# Patient Record
Sex: Female | Born: 1950 | Race: White | Hispanic: No | Marital: Married | Smoking: Never smoker
Health system: Southern US, Community
[De-identification: ages and names within clinical notes are randomized; demographics above are authoritative.]

## PROBLEM LIST (undated history)

## (undated) DIAGNOSIS — Z973 Presence of spectacles and contact lenses: Secondary | ICD-10-CM

## (undated) DIAGNOSIS — M1990 Unspecified osteoarthritis, unspecified site: Secondary | ICD-10-CM

## (undated) DIAGNOSIS — E785 Hyperlipidemia, unspecified: Secondary | ICD-10-CM

## (undated) DIAGNOSIS — Z87442 Personal history of urinary calculi: Secondary | ICD-10-CM

## (undated) DIAGNOSIS — Z8489 Family history of other specified conditions: Secondary | ICD-10-CM

## (undated) DIAGNOSIS — F329 Major depressive disorder, single episode, unspecified: Secondary | ICD-10-CM

## (undated) DIAGNOSIS — I10 Essential (primary) hypertension: Secondary | ICD-10-CM

## (undated) DIAGNOSIS — L80 Vitiligo: Secondary | ICD-10-CM

## (undated) DIAGNOSIS — F32A Depression, unspecified: Secondary | ICD-10-CM

## (undated) DIAGNOSIS — N200 Calculus of kidney: Secondary | ICD-10-CM

## (undated) HISTORY — DX: Unspecified osteoarthritis, unspecified site: M19.90

## (undated) HISTORY — DX: Vitiligo: L80

## (undated) HISTORY — DX: Essential (primary) hypertension: I10

## (undated) HISTORY — DX: Hyperlipidemia, unspecified: E78.5

## (undated) HISTORY — DX: Personal history of urinary calculi: Z87.442

## (undated) HISTORY — DX: Calculus of kidney: N20.0

---

## 1976-04-28 HISTORY — PX: OTHER SURGICAL HISTORY: SHX169

## 1997-12-19 ENCOUNTER — Other Ambulatory Visit: Admission: RE | Admit: 1997-12-19 | Discharge: 1997-12-19 | Payer: Self-pay | Admitting: *Deleted

## 1998-12-25 ENCOUNTER — Other Ambulatory Visit: Admission: RE | Admit: 1998-12-25 | Discharge: 1998-12-25 | Payer: Self-pay | Admitting: *Deleted

## 2000-01-20 ENCOUNTER — Other Ambulatory Visit: Admission: RE | Admit: 2000-01-20 | Discharge: 2000-01-20 | Payer: Self-pay | Admitting: *Deleted

## 2001-01-19 ENCOUNTER — Other Ambulatory Visit: Admission: RE | Admit: 2001-01-19 | Discharge: 2001-01-19 | Payer: Self-pay | Admitting: *Deleted

## 2002-01-24 ENCOUNTER — Other Ambulatory Visit: Admission: RE | Admit: 2002-01-24 | Discharge: 2002-01-24 | Payer: Self-pay | Admitting: *Deleted

## 2003-08-21 ENCOUNTER — Other Ambulatory Visit: Admission: RE | Admit: 2003-08-21 | Discharge: 2003-08-21 | Payer: Self-pay | Admitting: *Deleted

## 2004-08-29 ENCOUNTER — Other Ambulatory Visit: Admission: RE | Admit: 2004-08-29 | Discharge: 2004-08-29 | Payer: Self-pay | Admitting: *Deleted

## 2005-07-03 ENCOUNTER — Emergency Department: Payer: Self-pay | Admitting: Emergency Medicine

## 2005-12-22 ENCOUNTER — Other Ambulatory Visit: Admission: RE | Admit: 2005-12-22 | Discharge: 2005-12-22 | Payer: Self-pay | Admitting: *Deleted

## 2007-03-30 ENCOUNTER — Other Ambulatory Visit: Admission: RE | Admit: 2007-03-30 | Discharge: 2007-03-30 | Payer: Self-pay | Admitting: *Deleted

## 2007-04-13 ENCOUNTER — Ambulatory Visit: Payer: Self-pay | Admitting: Gastroenterology

## 2007-04-20 HISTORY — PX: US ECHOCARDIOGRAPHY: HXRAD669

## 2007-04-26 ENCOUNTER — Ambulatory Visit: Payer: Self-pay | Admitting: Gastroenterology

## 2007-04-26 ENCOUNTER — Encounter: Payer: Self-pay | Admitting: Gastroenterology

## 2007-04-26 HISTORY — PX: COLONOSCOPY: SHX174

## 2007-06-14 ENCOUNTER — Ambulatory Visit: Payer: Self-pay | Admitting: Cardiovascular Disease

## 2007-07-06 HISTORY — PX: NM MYOCAR PERF WALL MOTION: HXRAD629

## 2009-04-24 ENCOUNTER — Ambulatory Visit: Payer: Self-pay

## 2010-07-02 ENCOUNTER — Ambulatory Visit: Payer: Self-pay | Admitting: Urology

## 2010-07-09 ENCOUNTER — Ambulatory Visit: Payer: Self-pay | Admitting: Urology

## 2011-12-25 IMAGING — CR DG ABDOMEN 1V
1 series · 1 of 1 positions shown · non-contrast
Comparison: none

REASON FOR EXAM: kidney stone
COMMENTS:

[view not recorded]
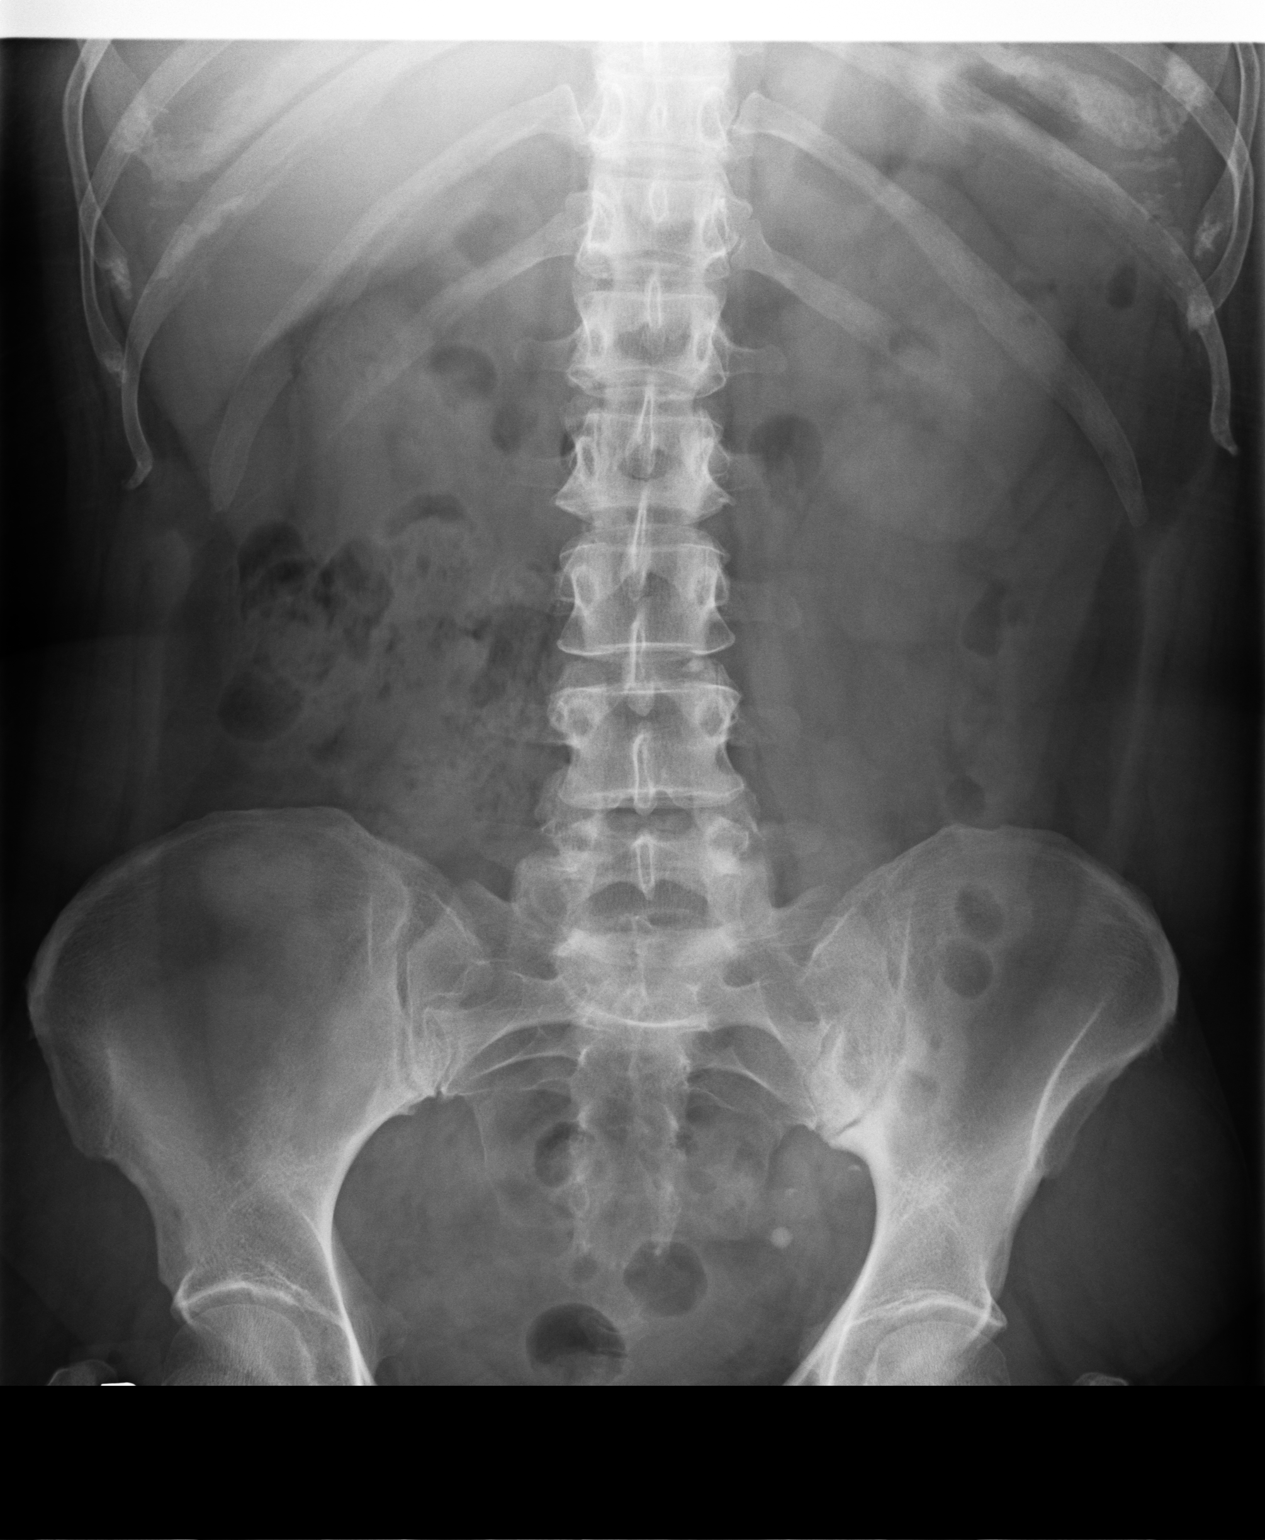

[1 of 1 positions shown; findings below may reference images not displayed]

PROCEDURE:     MDR - [facility] KIDNEY URETER BLADDER  - July 09, 2010 [DATE]

RESULT:     Comparison is made to a prior exam 07/02/2010. No renal
calcifications are seen on either side. There is again noted a concentric 5
mm calcification low in the left pelvis. Immediately superior to this
calcification there is a smaller calcification measuring approximately 2 to
3 mm. Both of these may represent phleboliths but the possibility of either
one representing a distal left ureteral stone cannot be totally excluded on
plain film examination. Both remain unchanged in position as compared to the
prior exam.
IMPRESSION: 1. There are again noted calcifications in the left pelvis consistent with
phleboliths although either could represent a distal left ureteral stone.
2. No intrarenal stones are identified.

## 2012-05-06 ENCOUNTER — Encounter: Payer: Self-pay | Admitting: Gastroenterology

## 2012-11-10 ENCOUNTER — Encounter: Payer: Self-pay | Admitting: Gastroenterology

## 2013-05-09 DIAGNOSIS — I10 Essential (primary) hypertension: Secondary | ICD-10-CM | POA: Insufficient documentation

## 2013-07-15 ENCOUNTER — Ambulatory Visit: Payer: Self-pay | Admitting: Medical

## 2013-07-15 LAB — URINALYSIS, COMPLETE
BACTERIA: NEGATIVE
BILIRUBIN, UR: NEGATIVE
Glucose,UR: NEGATIVE mg/dL (ref 0–75)
KETONE: NEGATIVE
Nitrite: NEGATIVE
PH: 7 (ref 4.5–8.0)
PROTEIN: NEGATIVE
SPECIFIC GRAVITY: 1.005 (ref 1.003–1.030)
SQUAMOUS EPITHELIAL: NONE SEEN
WBC UR: >30 /HPF (ref 0–5)

## 2013-07-17 LAB — URINE CULTURE

## 2014-07-15 ENCOUNTER — Ambulatory Visit: Payer: Self-pay | Admitting: Emergency Medicine

## 2014-11-06 ENCOUNTER — Encounter: Payer: Self-pay | Admitting: *Deleted

## 2014-11-10 ENCOUNTER — Encounter: Payer: Self-pay | Admitting: Cardiovascular Disease

## 2017-01-08 ENCOUNTER — Ambulatory Visit
Admission: EM | Admit: 2017-01-08 | Discharge: 2017-01-08 | Disposition: A | Discharge status: Home / Self Care | Payer: PRIVATE HEALTH INSURANCE | Attending: Emergency Medicine | Admitting: Emergency Medicine

## 2017-01-08 DIAGNOSIS — N3000 Acute cystitis without hematuria: Secondary | ICD-10-CM

## 2017-01-08 LAB — URINALYSIS, COMPLETE (UACMP) WITH MICROSCOPIC
Bacteria, UA: NONE SEEN
Bilirubin Urine: NEGATIVE
Glucose, UA: NEGATIVE mg/dL
Ketones, ur: NEGATIVE mg/dL
Nitrite: NEGATIVE
Protein, ur: NEGATIVE mg/dL
RBC / HPF: NONE SEEN RBC/hpf (ref 0–5)
Specific Gravity, Urine: 1.01 (ref 1.005–1.030)
pH: 7 (ref 5.0–8.0)

## 2017-01-08 MED ORDER — PHENAZOPYRIDINE HCL 200 MG PO TABS: 200.0000 mg | ORAL_TABLET | Freq: Three times a day (TID) | ORAL | 0 refills | Status: DC

## 2017-01-08 MED ORDER — CEPHALEXIN 500 MG PO CAPS: 500.0000 mg | ORAL_CAPSULE | Freq: Two times a day (BID) | ORAL | 0 refills | Status: DC

## 2017-01-08 MED ORDER — FLUCONAZOLE 150 MG PO TABS: ORAL_TABLET | ORAL | 0 refills | Status: DC

## 2017-01-08 NOTE — ED Triage Notes (Signed)
Patient complains of possible urinary tract infection with symptoms such as burning with urination and left lower flank pain. Patient states that she has been having urgency, and frequency.

## 2017-01-08 NOTE — ED Provider Notes (Signed)
[facility]    CSN: [id number] Arrival date & time: 01/08/17  1225     History   Chief Complaint Chief Complaint  Patient presents with  . Urinary Tract Infection    HPI Emily Pena is a 66 y.o. female.   HPI  This a 66 year old female who presents with symptoms of a possible urinary tract infection. Has had urinary tract infections repetitively over her lifetime. He states that she's had burning with urination urgency and frequency and left lower flank pain intermittently. He does have a history of kidney stones in the past. She also has a feeling of pressure that she describes in the vaginal area but describes no vaginal discharge no vaginal pain. 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        Past Medical History:  Diagnosis Date  . Hyperlipidemia   . Hypertension     There are no active problems to display for this patient.   Past Surgical History:  Procedure Laterality Date  . CESAREAN SECTION  1985 &1991  . NM MYOCAR PERF WALL MOTION  07/06/2007   no significant ischemia present  . US ECHOCARDIOGRAPHY  04/20/2007   normal  . vocal nodules  1978    OB History    No data available       Home Medications    Prior to Admission medications   Medication Sig Start Date End Date Taking? Authorizing Provider  aspirin 81 MG tablet Take 81 mg by mouth daily.    [provider]  cephALEXin (KEFLEX) 500 MG capsule Take 1 capsule (500 mg total) by mouth 2 (two) times daily. 01/08/17   Lutricia Feil, PA-C  fluconazole (DIFLUCAN) 150 MG tablet Take one tab for symptoms of yeast infection. Repeat x 1 in 72 hours. 01/08/17   Lutricia Feil, PA-C  losartan-hydrochlorothiazide (HYZAAR) 100-12.5 MG per tablet Take 1 tablet by mouth daily.    [provider]  montelukast (SINGULAIR) 10 MG tablet Take 10 mg by mouth at bedtime.    [provider]  phenazopyridine (PYRIDIUM) 200  MG tablet Take 1 tablet (200 mg total) by mouth 3 (three) times daily. 01/08/17   Lutricia Feil, PA-C    Family History Family History  Problem Relation Age of Onset  . Hypertension Mother   . Heart failure Father   . Aneurysm Maternal Grandfather   . Heart attack Paternal Grandfather   . Cancer Brother     Social History Social History  Substance Use Topics  . Smoking status: Never Smoker  . Smokeless tobacco: Never Used  . Alcohol use 0.6 - 1.2 oz/week    1 - 2 Standard drinks or equivalent per week     Allergies   Darvon [propoxyphene] and Demerol [meperidine]   Review of Systems Review of Systems  Constitutional: Positive for activity change. Negative for appetite change, chills, diaphoresis and fatigue.  Genitourinary: Positive for dysuria, flank pain, frequency and urgency. Negative for vaginal bleeding, vaginal discharge and vaginal pain.  All other systems reviewed and are negative.    Physical Exam Triage Vital Signs ED Triage Vitals  Enc Vitals Group     BP 01/08/17 1313 (!) 157/83     Pulse Rate 01/08/17 1313 67     Resp 01/08/17 1313 16     Temp 01/08/17 1313 98.2 F (36.8 C)     Temp Source 01/08/17 1313 Oral  SpO2 01/08/17 1313 96 %     Weight 01/08/17 1311 165 lb (74.8 kg)     Height 01/08/17 1311  (1.575 m)     Head Circumference --      Peak Flow --      Pain Score 01/08/17 1312 5     Pain Loc --      Pain Edu? --      Excl. in GC? --    No data found.   Updated Vital Signs BP (!) 157/83 (BP Location: Left Arm)   Pulse 67   Temp 98.2 F (36.8 C) (Oral)   Resp 16   Ht  (1.575 m)   Wt 165 lb (74.8 kg)   SpO2 96%   BMI 30.18 kg/m   Visual Acuity Right Eye Distance:   Left Eye Distance:   Bilateral Distance:    Right Eye Near:   Left Eye Near:    Bilateral Near:     Physical Exam  Constitutional: She is oriented to person, place, and time. She appears well-developed and well-nourished. No distress.  HENT:    Head: Normocephalic.  Eyes: Pupils are equal, round, and reactive to light.  Neck: Normal range of motion.  Pulmonary/Chest: Effort normal and breath sounds normal.  Abdominal: Soft. Bowel sounds are normal. She exhibits no distension and no mass. There is tenderness. There is no rebound and no guarding.  Patient has mild left lower quadrant pain just above this pubic symphysis.  Musculoskeletal: Normal range of motion.  Neurological: She is alert and oriented to person, place, and time.  Skin: Skin is warm and dry. She is not diaphoretic.  Psychiatric: She has a normal mood and affect. Her behavior is normal. Judgment and thought content normal.  Nursing note and vitals reviewed.    UC Treatments / Results  Labs (all labs ordered are listed, but only abnormal results are displayed) Labs Reviewed  URINALYSIS, COMPLETE (UACMP) WITH MICROSCOPIC - Abnormal; Notable for the following:       Result Value   Color, Urine STRAW (*)    Hgb urine dipstick MODERATE (*)    Leukocytes, UA SMALL (*)    Squamous Epithelial / LPF 0-5 (*)    All other components within normal limits  URINE CULTURE    EKG  EKG Interpretation None       Radiology No results found.  Procedures Procedures (including critical care time)  Medications Ordered in UC Medications - No data to display   Initial Impression / Assessment and Plan / UC Course  I have reviewed the triage vital signs and the nursing notes.  Pertinent labs & imaging results that were available during my care of the patient were reviewed by me and considered in my medical decision making (see chart for details).     Plan: 1. Test/x-ray results and diagnosis reviewed with patient 2. rx as per orders; risks, benefits, potential side effects reviewed with patient 3. Recommend supportive treatment with fluids. We'll treat her for a suspected urinary tract infection but have discussed other causes including but not limited to vaginal  infections caffeine irritation from soap  deodorants etc. Cultures of the urine should be available in 48 hours. If these are negative for any bacterial infection that I have recommended she follow-up with her GYN. 4. F/u prn if symptoms worsen or don't improve   Final Clinical Impressions(s) / UC Diagnoses   Final diagnoses:  Acute cystitis without hematuria    New  Prescriptions Discharge Medication List as of 01/08/2017  1:47 PM    START taking these medications   Details  cephALEXin (KEFLEX) 500 MG capsule Take 1 capsule (500 mg total) by mouth 2 (two) times daily., Starting Thu 01/08/2017, Normal    fluconazole (DIFLUCAN) 150 MG tablet Take one tab for symptoms of yeast infection. Repeat x 1 in 72 hours., Normal    phenazopyridine (PYRIDIUM) 200 MG tablet Take 1 tablet (200 mg total) by mouth 3 (three) times daily., Starting Thu 01/08/2017, Normal         Controlled Substance Prescriptions [place] Controlled Substance Registry consulted? Not Applicable   Lutricia FeilRoemer, Bayan Hedstrom P, PA-C 01/08/17 1538

## 2017-01-10 LAB — URINE CULTURE: Culture: <10000 — AB

## 2017-03-13 ENCOUNTER — Other Ambulatory Visit: Payer: Self-pay

## 2017-03-13 ENCOUNTER — Ambulatory Visit
Admission: EM | Admit: 2017-03-13 | Discharge: 2017-03-13 | Disposition: A | Discharge status: Home / Self Care | Payer: PRIVATE HEALTH INSURANCE | Attending: Emergency Medicine | Admitting: Emergency Medicine

## 2017-03-13 DIAGNOSIS — R3915 Urgency of urination: Secondary | ICD-10-CM

## 2017-03-13 DIAGNOSIS — R300 Dysuria: Secondary | ICD-10-CM

## 2017-03-13 DIAGNOSIS — R319 Hematuria, unspecified: Secondary | ICD-10-CM | POA: Diagnosis not present

## 2017-03-13 DIAGNOSIS — N390 Urinary tract infection, site not specified: Secondary | ICD-10-CM

## 2017-03-13 LAB — URINALYSIS, COMPLETE (UACMP) WITH MICROSCOPIC
BILIRUBIN URINE: NEGATIVE
GLUCOSE, UA: NEGATIVE mg/dL
KETONES UR: NEGATIVE mg/dL
Nitrite: NEGATIVE
PROTEIN: NEGATIVE mg/dL
Specific Gravity, Urine: 1.02 (ref 1.005–1.030)
pH: 7 (ref 5.0–8.0)

## 2017-03-13 MED ORDER — PHENAZOPYRIDINE HCL 200 MG PO TABS: 200.0000 mg | ORAL_TABLET | Freq: Three times a day (TID) | ORAL | 0 refills | Status: DC | PRN

## 2017-03-13 MED ORDER — IBUPROFEN 600 MG PO TABS: 600.0000 mg | ORAL_TABLET | Freq: Four times a day (QID) | ORAL | 0 refills | Status: DC | PRN

## 2017-03-13 MED ORDER — NITROFURANTOIN MONOHYD MACRO 100 MG PO CAPS: 100.0000 mg | ORAL_CAPSULE | Freq: Two times a day (BID) | ORAL | 0 refills | Status: DC

## 2017-03-13 MED ORDER — FLUCONAZOLE 150 MG PO TABS: ORAL_TABLET | ORAL | 0 refills | Status: DC

## 2017-03-13 NOTE — ED Provider Notes (Signed)
HPI  SUBJECTIVE:  Emily Pena is a 66 y.o. female who presents with dysuria, urgency, frequency, cloudy urine starting last night.  She reports low abdominal pain described as throbbing, pressure.  She reports left back tenderness with palpation only.  No nausea, vomiting, fevers, flank tenderness, other abdominal pain.  No other pelvic pain.  No vaginal bleeding, odor, rash, discharge.  She is in a monogamous long-term relationship with her husband, who is asymptomatic.  STDs are not a concern today.  She tried increasing fluids with some improvement of her symptoms, no aggravating factors.  She has a past medical history of frequent UTIs, pyelonephritis, nonobstructing nephrolithiasis, left side.  No history of diabetes, hypertension, gonorrhea, chlamydia, HIV, HSV, syphilis, trichomonas, BV.  She states that she gets yeast infections after taking antibiotics.  PMD[facility] [place].  States that she has a follow-up appointment with urology on December 10.  States that she would like a primary care referral.   Past Medical History:  Diagnosis Date  . Hyperlipidemia   . Hypertension     Past Surgical History:  Procedure Laterality Date  . CESAREAN SECTION  1985 &1991  . NM MYOCAR PERF WALL MOTION  07/06/2007   no significant ischemia present  . US ECHOCARDIOGRAPHY  04/20/2007   normal  . vocal nodules  1978    Family History  Problem Relation Age of Onset  . Hypertension Mother   . Heart failure Father   . Aneurysm Maternal Grandfather   . Heart attack Paternal Grandfather   . Cancer Brother     Social History   Tobacco Use  . Smoking status: Current Every Day Smoker    Types: Cigarettes  . Smokeless tobacco: Never Used  Substance Use Topics  . Alcohol use: Yes    Alcohol/week: 0.6 - 1.2 oz    Types: 1 - 2 Standard drinks or equivalent per week  . Drug use: No    No current facility-administered medications for this encounter.   Current Outpatient  Medications:  .  aspirin 81 MG tablet, Take 81 mg by mouth daily., Disp: , Rfl:  .  fluconazole (DIFLUCAN) 150 MG tablet, Take one tab for symptoms of yeast infection. Repeat x 1 in 72 hours., Disp: 2 tablet, Rfl: 0 .  ibuprofen (ADVIL,MOTRIN) 600 MG tablet, Take 1 tablet (600 mg total) every 6 (six) hours as needed by mouth., Disp: 30 tablet, Rfl: 0 .  losartan-hydrochlorothiazide (HYZAAR) 100-12.5 MG per tablet, Take 1 tablet by mouth daily., Disp: , Rfl:  .  montelukast (SINGULAIR) 10 MG tablet, Take 10 mg by mouth at bedtime., Disp: , Rfl:  .  nitrofurantoin, macrocrystal-monohydrate, (MACROBID) 100 MG capsule, Take 1 capsule (100 mg total) 2 (two) times daily by mouth. X 5 days, Disp: 10 capsule, Rfl: 0 .  phenazopyridine (PYRIDIUM) 200 MG tablet, Take 1 tablet (200 mg total) 3 (three) times daily as needed by mouth for pain., Disp: 6 tablet, Rfl: 0  Allergies  Allergen Reactions  . Darvon [Propoxyphene]   . Demerol [Meperidine]      ROS  As noted in HPI.   Physical Exam  BP (!) 158/90 (BP Location: Right Arm)   Pulse 75   Temp 98 F (36.7 C) (Oral)   Resp 16   Ht 5\' 2"  (1.575 m)   Wt 160 lb (72.6 kg)   SpO2 97%   BMI 29.26 kg/m   Constitutional: Well developed, well nourished, no acute distress Eyes:  EOMI,  conjunctiva normal bilaterally HENT: Normocephalic, atraumatic,mucus membranes moist Respiratory: Normal inspiratory effort Cardiovascular: Normal rate GI: nondistended.  Soft.  No suprapubic or flank tenderness  Back: No CVA tenderness skin: No rash, skin intact Musculoskeletal: no deformities Neurologic: Alert & oriented x 3, no focal neuro deficits Psychiatric: Speech and behavior appropriate   ED Course   Medications - No data to display  Orders Placed This Encounter  Procedures  . Urine culture    Standing Status:   Standing    Number of Occurrences:   1  . Urinalysis, Complete w Microscopic    Standing Status:   Standing    Number of  Occurrences:   1  . Strain all urine    Standing Status:   Standing    Number of Occurrences:   1    Results for orders placed or performed during the hospital encounter of 03/13/17 (from the past 24 hour(s))  Urinalysis, Complete w Microscopic     Status: Abnormal   Collection Time: 03/13/17  8:33 AM  Result Value Ref Range   Color, Urine YELLOW YELLOW   APPearance CLOUDY (A) CLEAR   Specific Gravity, Urine 1.020 1.005 - 1.030   pH 7.0 5.0 - 8.0   Glucose, UA NEGATIVE NEGATIVE mg/dL   Hgb urine dipstick MODERATE (A) NEGATIVE   Bilirubin Urine NEGATIVE NEGATIVE   Ketones, ur NEGATIVE NEGATIVE mg/dL   Protein, ur NEGATIVE NEGATIVE mg/dL   Nitrite NEGATIVE NEGATIVE   Leukocytes, UA SMALL (A) NEGATIVE   Squamous Epithelial / LPF 6-30 (A) NONE SEEN   WBC, UA TOO NUMEROUS TO COUNT 0 - 5 WBC/hpf   RBC / HPF 6-30 0 - 5 RBC/hpf   Bacteria, UA FEW (A) NONE SEEN   No results found.  ED Clinical Impression  Urinary tract infection with hematuria, site unspecified   ED Assessment/Plan  Presentation suggestive of UTI.  She did give us a contaminated specimen, however given her symptoms we will send home with Macrobid, Pyridium.  Ibuprofen 600 mg in case this is stones, will provide strainer.  Will give Diflucan as she states she gets frequent yeast infections with UTIs and a primary care referral.  Discussed labs,  MDM, plan and followup with patient. Discussed sn/sx that should prompt return to the ED. patient agrees with plan.   Meds ordered this encounter  Medications  . fluconazole (DIFLUCAN) 150 MG tablet    Sig: Take one tab for symptoms of yeast infection. Repeat x 1 in 72 hours.    Dispense:  2 tablet    Refill:  0  . nitrofurantoin, macrocrystal-monohydrate, (MACROBID) 100 MG capsule    Sig: Take 1 capsule (100 mg total) 2 (two) times daily by mouth. X 5 days    Dispense:  10 capsule    Refill:  0  . phenazopyridine (PYRIDIUM) 200 MG tablet    Sig: Take 1 tablet (200 mg  total) 3 (three) times daily as needed by mouth for pain.    Dispense:  6 tablet    Refill:  0  . ibuprofen (ADVIL,MOTRIN) 600 MG tablet    Sig: Take 1 tablet (600 mg total) every 6 (six) hours as needed by mouth.    Dispense:  30 tablet    Refill:  0    *This clinic note was created using Scientist, clinical (histocompatibility and immunogenetics)Dragon dictation software. Therefore, there may be occasional mistakes despite careful proofreading.   ?    Domenick GongMortenson, Nomar Broad, MD 03/13/17 [phone]

## 2017-03-13 NOTE — Discharge Instructions (Signed)
Here is a list of primary care providers who are taking new patients: ° °Dr. Deanna Jones, Dr. William Plonk °[address] °[address] °[place] [address] °[phone] ° °[facility] [place] °[address] [place] [address]  °[place] [address]  °[phone] ° °[facility] °[address]  °[place], [place] [address] °[phone] ° °[facility] [place] °[address]  °[phone] °[place], [place] [address] ° °Here are clinics/ other resources who will see you if you do not have insurance. Some have certain criteria that you must meet. Call them and find out what they are: ° °[facility]: °[address] [place] [address] [place], [place] [address] °Phone: [phone] °Hours: First and Third Saturdays of each Month, 9 a.m. - 1 p.m. ° °[facility]: °[address], [place], [place] [address] °Phone: [phone] °Hours: °Tuesday, 4 p.m. - 8 p.m. °Thursday, 1 p.m. - 8 p.m. °Wednesday, 9 a.m. - Noon ° °[facility] °[address], [place], [place] [address] °Phone: [phone] °Pharmacy Phone Number: [phone] °Dental Phone Number: [phone] °[employer] Insurance Help: [phone] ° °Dental Hours: °Monday - Thursday, 8 a.m. - 6 p.m. ° °[facility] °[address] [place], [place] [address] °Phone: [phone] °Pharmacy Phone Number: [phone] °ACA Insurance Help: [phone] ° °[facility] °[address] [place], [place] [address] °Phone: [phone] °Pharmacy Phone Number: [phone] °ACA Insurance Help: [phone] ° °[facility] °[address] [place], [place] [address] °Phone: [phone] °ACA Insurance Help: [phone]  ° °[facility] °[address] [place], [place] [address] °Phone: [phone] ° °Go to www.goodrx.com to look up your medications. This will give you a list of where you can find your prescriptions at the most affordable prices. Or ask the pharmacist what the cash price is,  or if they have any other discount programs available to help make your medication more affordable. This can be less expensive than what you would pay with insurance.   °

## 2017-03-13 NOTE — ED Triage Notes (Signed)
Pt with urinary frequency and dysuria starting last night.

## 2017-03-14 LAB — URINE CULTURE: CULTURE: NO GROWTH

## 2017-05-06 ENCOUNTER — Other Ambulatory Visit: Payer: Self-pay

## 2017-05-06 ENCOUNTER — Ambulatory Visit
Admission: EM | Admit: 2017-05-06 | Discharge: 2017-05-06 | Disposition: A | Discharge status: Home / Self Care | Payer: PRIVATE HEALTH INSURANCE | Attending: Emergency Medicine | Admitting: Emergency Medicine

## 2017-05-06 DIAGNOSIS — H6691 Otitis media, unspecified, right ear: Secondary | ICD-10-CM

## 2017-05-06 DIAGNOSIS — R059 Cough, unspecified: Secondary | ICD-10-CM

## 2017-05-06 DIAGNOSIS — R05 Cough: Secondary | ICD-10-CM

## 2017-05-06 MED ORDER — AZITHROMYCIN 250 MG PO TABS: ORAL_TABLET | ORAL | 0 refills | Status: DC

## 2017-05-06 MED ORDER — ALBUTEROL SULFATE HFA 108 (90 BASE) MCG/ACT IN AERS: 2.0000 | INHALATION_SPRAY | RESPIRATORY_TRACT | 0 refills | Status: DC | PRN

## 2017-05-06 MED ORDER — BENZONATATE 100 MG PO CAPS: 100.0000 mg | ORAL_CAPSULE | Freq: Three times a day (TID) | ORAL | 0 refills | Status: DC | PRN

## 2017-05-06 MED ORDER — GUAIFENESIN-CODEINE 100-10 MG/5ML PO SOLN: 5.0000 mL | Freq: Every evening | ORAL | 0 refills | Status: DC | PRN

## 2017-05-06 NOTE — ED Provider Notes (Signed)
[facility] ____________________________________________  Time seen: Approximately 8:25 AM  I have reviewed the triage vital signs and the nursing notes.   HISTORY  Chief Complaint Cough   HPI Emily Pena is a 67 y.o. female presenting for evaluation of 1 week of cough and congestion symptoms.  Patient reports symptoms initially started out with more nasal congestion and nasal drainage, but states nasal congestion has mostly resolved and continues with cough.  States cough is a dry hacking nonproductive cough.  Denies hemoptysis.  States  cough is interrupting sleep.  Denies known fevers.  States has had some body aches, but does not feel like she had a fever.  States has had intermittent right ear pain as well.  Reports continues to eat and drink well.  States cough is unresolved with over-the-counter Mucinex and decongestant agents.  Reports is continue to remain active.  Denies other aggravating or alleviating factors. Denies chest pain, shortness of breath, abdominal pain, dysuria, or rash. Denies recent sickness. Denies recent antibiotic use.  Denies cardiac history.  Denies renal insufficiency.    Past Medical History:  Diagnosis Date  . Hyperlipidemia   . Hypertension     There are no active problems to display for this patient.   Past Surgical History:  Procedure Laterality Date  . CESAREAN SECTION  1985 &1991  . NM MYOCAR PERF WALL MOTION  07/06/2007   no significant ischemia present  . US ECHOCARDIOGRAPHY  04/20/2007   normal  . vocal nodules  1978     No current facility-administered medications for this encounter.   Current Outpatient Medications:  .  ibuprofen (ADVIL,MOTRIN) 600 MG tablet, Take 1 tablet (600 mg total) every 6 (six) hours as needed by mouth., Disp: 30 tablet, Rfl: 0 .  montelukast (SINGULAIR) 10 MG tablet, Take 10 mg by mouth at bedtime., Disp: , Rfl:  .  albuterol (PROVENTIL HFA;VENTOLIN HFA) 108 (90 Base) MCG/ACT inhaler,  Inhale 2 puffs into the lungs every 4 (four) hours as needed for wheezing., Disp: 1 Inhaler, Rfl: 0 .  azithromycin (ZITHROMAX Z-PAK) 250 MG tablet, Take 2 tablets (500 mg) on  Day 1,  followed by 1 tablet (250 mg) once daily on Days 2 through 5., Disp: 6 each, Rfl: 0 .  benzonatate (TESSALON PERLES) 100 MG capsule, Take 1 capsule (100 mg total) by mouth 3 (three) times daily as needed for cough., Disp: 15 capsule, Rfl: 0 .  guaiFENesin-codeine 100-10 MG/5ML syrup, Take 5 mLs by mouth at bedtime as needed for cough. Do not drive or operate machinery as can cause drowsiness., Disp: 75 mL, Rfl: 0  Allergies Darvon [propoxyphene] and Demerol [meperidine]  Family History  Problem Relation Age of Onset  . Hypertension Mother   . Heart failure Father   . Aneurysm Maternal Grandfather   . Heart attack Paternal Grandfather   . Cancer Brother     Social History Social History   Tobacco Use  . Smoking status: Never Smoker  . Smokeless tobacco: Never Used  Substance Use Topics  . Alcohol use: Yes    Alcohol/week: 0.6 - 1.2 oz    Types: 1 - 2 Standard drinks or equivalent per week  . Drug use: No    Review of Systems Constitutional: No fever/chills ENT: No sore throat. Cardiovascular: Denies chest pain. Respiratory: Denies shortness of breath. Gastrointestinal: No abdominal pain.   Genitourinary: Negative for dysuria. Musculoskeletal: Negative for back pain. Skin: Negative for rash.  ____________________________________________   PHYSICAL EXAM:  VITAL SIGNS: ED Triage Vitals  Enc Vitals Group     BP 05/06/17 0818 (!) 147/71     Pulse Rate 05/06/17 0818 80     Resp 05/06/17 0818 18     Temp 05/06/17 0818 97.8 F (36.6 C)     Temp Source 05/06/17 0818 Oral     SpO2 05/06/17 0818 98 %     Weight 05/06/17 0815 160 lb (72.6 kg)     Height 05/06/17 0815 5\' 2"  (1.575 m)     Head Circumference --      Peak Flow --      Pain Score 05/06/17 0815 3     Pain Loc --      Pain Edu?  --      Excl. in GC? --    Constitutional: Alert and oriented. Well appearing and in no acute distress. Eyes: Conjunctivae are normal. . Head: Atraumatic. No sinus tenderness to palpation. No swelling. No erythema.  Ears: Left: Nontender, no erythema, normal TM.  Right: Nontender, normal canal, moderate erythema and dull TM.  Nose:Mild nasal congestion   Mouth/Throat: Mucous membranes are moist. No pharyngeal erythema. No tonsillar swelling or exudate.  Neck: No stridor.  No cervical spine tenderness to palpation. Hematological/Lymphatic/Immunilogical: No cervical lymphadenopathy. Cardiovascular: Normal rate, regular rhythm. Grossly normal heart sounds.  Good peripheral circulation. Respiratory: Normal respiratory effort.  No retractions. No wheezes.  Mild scattered rhonchi.  Good air movement.  Dry intermittent cough noted in room with mild bronchospasm.  Speaks in complete sentences. Musculoskeletal: Ambulatory with steady gait.  Neurologic:  Normal speech and language. No gait instability. Skin:  Skin appears warm, dry and intact. No rash noted. Psychiatric: Mood and affect are normal. Speech and behavior are normal. ___________________________________________   LABS (all labs ordered are listed, but only abnormal results are displayed)  Labs Reviewed - No data to display ____________________________________________    PROCEDURES Procedures    INITIAL IMPRESSION / ASSESSMENT AND PLAN / ED COURSE  Pertinent labs & imaging results that were available during my care of the patient were reviewed by me and considered in my medical decision making (see chart for details).  Well-appearing patient.  No acute distress.  Suspect recent viral upper respiratory infection.  Concern for secondary infection.  Right otitis media.  Will treat patient with oral azithromycin, as needed guaifenesin with codeine, appearing Tessalon Perles and as needed albuterol inhaler. Discussed indication, risks  and benefits of medications with patient.  Discussed follow up with Primary care physician this week. Discussed follow up and return parameters including no resolution or any worsening concerns. Patient verbalized understanding and agreed to plan.   ____________________________________________   FINAL CLINICAL IMPRESSION(S) / ED DIAGNOSES  Final diagnoses:  Cough  Right otitis media, unspecified otitis media type     ED Discharge Orders        Ordered    albuterol (PROVENTIL HFA;VENTOLIN HFA) 108 (90 Base) MCG/ACT inhaler  Every 4 hours PRN     05/06/17 0834    azithromycin (ZITHROMAX Z-PAK) 250 MG tablet     05/06/17 0834    benzonatate (TESSALON PERLES) 100 MG capsule  3 times daily PRN     05/06/17 0834    guaiFENesin-codeine 100-10 MG/5ML syrup  At bedtime PRN     05/06/17 0834       Note: This dictation was prepared with Dragon dictation along with smaller phrase technology. Any transcriptional errors that result from this process are unintentional.  Renford Dills, NP 05/06/17 [phone]

## 2017-05-06 NOTE — Discharge Instructions (Signed)
Take medication as prescribed. Rest. Drink plenty of fluids.  ° °Follow up with your primary care physician this week as needed. Return to Urgent care for new or worsening concerns.  ° °

## 2017-05-06 NOTE — ED Triage Notes (Signed)
Patient complains of cough x 1 week. Patient states that she has been having sinus pain and pressure with headache, right ear pain. Patient states that symptoms had got better but the cough has remained. Patient states that the cough is worse at night and is unable to sleep.

## 2017-05-09 ENCOUNTER — Telehealth: Payer: Self-pay | Admitting: Emergency Medicine

## 2017-05-09 NOTE — Telephone Encounter (Signed)
Called patient to follow-up with her regarding [facility].  Patient states that she still has her cough and ear is still hurting.  Patient to finish her Z-Pack and follow-up here or with PCP if symptoms persist or worsen. Patient verbalized understanding.

## 2017-05-14 ENCOUNTER — Other Ambulatory Visit: Payer: Self-pay

## 2017-05-14 ENCOUNTER — Ambulatory Visit
Admission: EM | Admit: 2017-05-14 | Discharge: 2017-05-14 | Disposition: A | Discharge status: Home / Self Care | Payer: PRIVATE HEALTH INSURANCE | Attending: Family Medicine | Admitting: Family Medicine

## 2017-05-14 DIAGNOSIS — H6981 Other specified disorders of Eustachian tube, right ear: Secondary | ICD-10-CM | POA: Diagnosis not present

## 2017-05-14 DIAGNOSIS — R030 Elevated blood-pressure reading, without diagnosis of hypertension: Secondary | ICD-10-CM | POA: Diagnosis not present

## 2017-05-14 MED ORDER — FLUTICASONE PROPIONATE 50 MCG/ACT NA SUSP: 2.0000 | Freq: Every day | NASAL | 0 refills | Status: DC

## 2017-05-14 NOTE — ED Triage Notes (Addendum)
Pt treated for bronchitis with Zpack and had ear pressure/pain at that time and was told there was fluid in it. Now finished zpack and still having ear pressure and would like another medication to treat. Pain 4/10. Blood pressure higher than usual in triage today and also this morning at her other dr appt.

## 2017-05-14 NOTE — ED Provider Notes (Signed)
[facility]    CSN: [id number] Arrival date & time: 05/14/17  1526  History   Chief Complaint Chief Complaint  Patient presents with  . Otalgia   HPI  67 year old female presents with right ear discomfort.  Patient recently seen on 1/9 and was treated for bronchitis and otitis.  She states that she is had improvement in her symptoms but continues to have right ear discomfort.  She states that she is having difficulty hearing out of the right ear.  Right ear feels full.  Congested.  Painful when  her ear pops.  No associated fever.  She has had improvement but no complete resolution.  She is concerned that her recent infection has not resolved.  No fever.  No other associated symptoms.  No other complaints at this time.  Past Medical History:  Diagnosis Date  . Hyperlipidemia   . Hypertension    Past Surgical History:  Procedure Laterality Date  . CESAREAN SECTION  1985 &1991  . NM MYOCAR PERF WALL MOTION  07/06/2007   no significant ischemia present  . US ECHOCARDIOGRAPHY  04/20/2007   normal  . vocal nodules  1978    OB History    No data available      Home Medications    Prior to Admission medications   Medication Sig Start Date End Date Taking? Authorizing Provider  benzonatate (TESSALON PERLES) 100 MG capsule Take 1 capsule (100 mg total) by mouth 3 (three) times daily as needed for cough. 05/06/17   Renford DillsMiller, Lindsey, NP  fluticasone (FLONASE) 50 MCG/ACT nasal spray Place 2 sprays into both nostrils daily. 05/14/17   Tommie Samsook, Latiana Tomei G, DO  ibuprofen (ADVIL,MOTRIN) 600 MG tablet Take 1 tablet (600 mg total) every 6 (six) hours as needed by mouth. 03/13/17   Domenick GongMortenson, Ashley, MD  montelukast (SINGULAIR) 10 MG tablet Take 10 mg by mouth at bedtime.    [provider]    Family History Family History  Problem Relation Age of Onset  . Hypertension Mother   . Heart failure Father   . Aneurysm Maternal Grandfather   . Heart attack Paternal Grandfather    . Cancer Brother     Social History Social History   Tobacco Use  . Smoking status: Never Smoker  . Smokeless tobacco: Never Used  Substance Use Topics  . Alcohol use: Yes    Alcohol/week: 0.6 - 1.2 oz    Types: 1 - 2 Standard drinks or equivalent per week  . Drug use: No   Allergies   Darvon [propoxyphene] and Demerol [meperidine]  Review of Systems Review of Systems  Constitutional: Negative.   HENT: Positive for ear pain. Negative for ear discharge.    Physical Exam Triage Vital Signs ED Triage Vitals  Enc Vitals Group     BP 05/14/17 1549 (!) 190/82     Pulse --      Resp 05/14/17 1549 18     Temp 05/14/17 1549 98.3 F (36.8 C)     Temp src --      SpO2 05/14/17 1549 99 %     Weight 05/14/17 1549 160 lb (72.6 kg)     Height 05/14/17 1549 5\' 2"  (1.575 m)     Head Circumference --      Peak Flow --      Pain Score 05/14/17 1551 4     Pain Loc --      Pain Edu? --      Excl. in GC? --  Updated Vital Signs BP (!) 158/96 (BP Location: Left Arm)   Temp 98.3 F (36.8 C)   Resp 18   Ht 5\' 2"  (1.575 m)   Wt 160 lb (72.6 kg)   SpO2 99%   BMI 29.26 kg/m    Physical Exam  Constitutional: She is oriented to person, place, and time. She appears well-developed. No distress.  HENT:  Head: Normocephalic and atraumatic.  Left TM normal. Right TM - effusion. No erythema.  Cardiovascular: Normal rate and regular rhythm.  No murmur heard. Pulmonary/Chest: Effort normal and breath sounds normal. She has no wheezes. She has no rales.  Neurological: She is oriented to person, place, and time.  Psychiatric: She has a normal mood and affect. Her behavior is normal.  Nursing note and vitals reviewed.  UC Treatments / Results  Labs (all labs ordered are listed, but only abnormal results are displayed) Labs Reviewed - No data to display  EKG  EKG Interpretation None       Radiology No results found.  Procedures Procedures (including critical care  time)  Medications Ordered in UC Medications - No data to display   Initial Impression / Assessment and Plan / UC Course  I have reviewed the triage vital signs and the nursing notes.  Pertinent labs & imaging results that were available during my care of the patient were reviewed by me and considered in my medical decision making (see chart for details).    67 year old female presents with right ear effusion/states tube dysfunction.  I see no indication for antibiotic therapy at this time.  If persists, follow-up with ENT.  Flonase to help with eustachian tube dysfunction.  Additionally, patient's blood pressure was elevated markedly here.  Rechecked and was improved, in the 150s systolic.  Advised to follow-up with primary care for to return here if her home blood pressures are persistently elevated greater than or equal to 150/90.  Final Clinical Impressions(s) / UC Diagnoses   Final diagnoses:  Dysfunction of right eustachian tube  Elevated BP without diagnosis of hypertension   ED Discharge Orders        Ordered    fluticasone (FLONASE) 50 MCG/ACT nasal spray  Daily     05/14/17 1634     Controlled Substance Prescriptions [place] Controlled Substance Registry consulted? Not Applicable   Tommie Sams, DO 05/14/17 1702

## 2017-05-14 NOTE — Discharge Instructions (Signed)
Check BP daily at home. If consistently elevated >150/90, please be seen.  Recommendations for primary care - [place]  Flonase for the ears.  Take care  Dr. Adriana Simasook

## 2017-05-17 ENCOUNTER — Telehealth: Payer: Self-pay

## 2017-05-17 NOTE — Telephone Encounter (Signed)
Called to follow up with patient since visit here at [facility]. Patient reports improvement.  Patient instructed to call back with any questions or concerns. [facility]  

## 2017-05-20 ENCOUNTER — Telehealth: Payer: Self-pay

## 2017-05-20 NOTE — Telephone Encounter (Signed)
Patient called in this morning reporting that blood pressure is still running high and would like to follow up with Dr. Adriana Simasook, since he knows her. Advised that he should be back in the office tomorrow, offered information to make an appointment online. [facility]

## 2017-05-21 ENCOUNTER — Encounter: Payer: Self-pay | Admitting: Emergency Medicine

## 2017-05-21 ENCOUNTER — Other Ambulatory Visit: Payer: Self-pay

## 2017-05-21 ENCOUNTER — Ambulatory Visit
Admission: EM | Admit: 2017-05-21 | Discharge: 2017-05-21 | Disposition: A | Discharge status: Home / Self Care | Payer: PRIVATE HEALTH INSURANCE | Attending: Family Medicine | Admitting: Family Medicine

## 2017-05-21 DIAGNOSIS — R42 Dizziness and giddiness: Secondary | ICD-10-CM | POA: Diagnosis not present

## 2017-05-21 DIAGNOSIS — I10 Essential (primary) hypertension: Secondary | ICD-10-CM | POA: Diagnosis not present

## 2017-05-21 MED ORDER — HYDROCHLOROTHIAZIDE 25 MG PO TABS: 25.0000 mg | ORAL_TABLET | Freq: Every day | ORAL | 0 refills | Status: DC

## 2017-05-21 NOTE — ED Provider Notes (Signed)
[facility]    CSN: [id number] Arrival date & time: 05/21/17  0948  History   Chief Complaint Chief Complaint  Patient presents with  . Hypertension    APPOINTMENT   HPI  67 year old female with a prior history of hypertension presents with elevated blood pressures.  Patient was recently seen here.  Her blood pressure was markedly elevated but came down after recheck.  I advised her to check her blood pressures at home.  She has done so.  Her blood pressures have all been elevated greater than 150 systolic.  She has several blood pressure readings in the 170s-180s systolic.  She has had some dizziness and has generally not been feeling well.  She is concerned about her blood pressure elevation.  She has an upcoming appointment with primary care in early February.  She would like to discuss starting medication today.  Past Medical History:  Diagnosis Date  . Hyperlipidemia   . Hypertension    Past Surgical History:  Procedure Laterality Date  . CESAREAN SECTION  1985 &1991  . NM MYOCAR PERF WALL MOTION  07/06/2007   no significant ischemia present  . US ECHOCARDIOGRAPHY  04/20/2007   normal  . vocal nodules  1978    OB History    No data available     Home Medications    Prior to Admission medications   Medication Sig Start Date End Date Taking? Authorizing Provider  benzonatate (TESSALON PERLES) 100 MG capsule Take 1 capsule (100 mg total) by mouth 3 (three) times daily as needed for cough. 05/06/17  Yes Renford DillsMiller, Lindsey, NP  fluticasone (FLONASE) 50 MCG/ACT nasal spray Place 2 sprays into both nostrils daily. 05/14/17  Yes Makaylynn Bonillas G, DO  montelukast (SINGULAIR) 10 MG tablet Take 10 mg by mouth at bedtime.   Yes [provider]  hydrochlorothiazide (HYDRODIURIL) 25 MG tablet Take 1 tablet (25 mg total) by mouth daily. 05/21/17   Tommie Samsook, Tyriq Moragne G, DO  ibuprofen (ADVIL,MOTRIN) 600 MG tablet Take 1 tablet (600 mg total) every 6 (six) hours as needed by  mouth. 03/13/17   Domenick GongMortenson, Ashley, MD    Family History Family History  Problem Relation Age of Onset  . Hypertension Mother   . Heart failure Father   . Aneurysm Maternal Grandfather   . Heart attack Paternal Grandfather   . Cancer Brother     Social History Social History   Tobacco Use  . Smoking status: Never Smoker  . Smokeless tobacco: Never Used  Substance Use Topics  . Alcohol use: Yes    Alcohol/week: 0.6 - 1.2 oz    Types: 1 - 2 Standard drinks or equivalent per week  . Drug use: No     Allergies   Darvon [propoxyphene] and Demerol [meperidine]   Review of Systems Review of Systems  Constitutional: Negative.   Cardiovascular: Negative.   Neurological: Positive for dizziness.   Physical Exam Triage Vital Signs ED Triage Vitals  Enc Vitals Group     BP 05/21/17 1007 (!) 150/85     Pulse Rate 05/21/17 1007 78     Resp 05/21/17 1007 16     Temp 05/21/17 1007 98.2 F (36.8 C)     Temp Source 05/21/17 1007 Oral     SpO2 05/21/17 1007 96 %     Weight 05/21/17 1005 160 lb (72.6 kg)     Height 05/21/17 1005 5\' 2"  (1.575 m)     Head Circumference --  Peak Flow --      Pain Score 05/21/17 1004 0     Pain Loc --      Pain Edu? --      Excl. in GC? --    Updated Vital Signs BP (!) 150/85 (BP Location: Left Arm)   Pulse 78   Temp 98.2 F (36.8 C) (Oral)   Resp 16   Ht 5\' 2"  (1.575 m)   Wt 160 lb (72.6 kg)   SpO2 96%   BMI 29.26 kg/m   Physical Exam  Constitutional: She is oriented to person, place, and time. She appears well-developed. No distress.  HENT:  Head: Normocephalic and atraumatic.  Eyes: Conjunctivae are normal. Pupils are equal, round, and reactive to light.  Cardiovascular: Normal rate and regular rhythm.  No murmur heard. Pulmonary/Chest: Effort normal and breath sounds normal. She has no wheezes. She has no rales.  Neurological: She is alert and oriented to person, place, and time.  Psychiatric: She has a normal mood and  affect. Her behavior is normal.  Nursing note and vitals reviewed.  UC Treatments / Results  Labs (all labs ordered are listed, but only abnormal results are displayed) Labs Reviewed - No data to display  EKG  EKG Interpretation None       Radiology No results found.  Procedures Procedures (including critical care time)  Medications Ordered in UC Medications - No data to display   Initial Impression / Assessment and Plan / UC Course  I have reviewed the triage vital signs and the nursing notes.  Pertinent labs & imaging results that were available during my care of the patient were reviewed by me and considered in my medical decision making (see chart for details).     67 year old female presents with hypertension.  Well-appearing.  After discussion, starting on HCTZ.  Follow-up with primary care.  Final Clinical Impressions(s) / UC Diagnoses   Final diagnoses:  Essential hypertension    ED Discharge Orders        Ordered    hydrochlorothiazide (HYDRODIURIL) 25 MG tablet  Daily     05/21/17 1034     Controlled Substance Prescriptions [place] Controlled Substance Registry consulted? Not Applicable   Tommie Sams, DO 05/21/17 1056

## 2017-05-21 NOTE — ED Triage Notes (Signed)
Patient states that at her past visits she has elevated blood pressure reading.  Patient is here to follow-up because when she has checked it at home her readings have been elevated also.  Patient has a new patient appointment with Dr. Judithann GravesBerglund 2/2.

## 2017-05-29 ENCOUNTER — Other Ambulatory Visit: Payer: Self-pay | Admitting: Internal Medicine

## 2017-06-02 ENCOUNTER — Ambulatory Visit (INDEPENDENT_AMBULATORY_CARE_PROVIDER_SITE_OTHER): Payer: PRIVATE HEALTH INSURANCE | Admitting: Internal Medicine

## 2017-06-02 ENCOUNTER — Encounter: Payer: Self-pay | Admitting: Internal Medicine

## 2017-06-02 VITALS — BP 132/92 | HR 87 | Ht 62.0 in | Wt 164.0 lb

## 2017-06-02 DIAGNOSIS — L80 Vitiligo: Secondary | ICD-10-CM | POA: Insufficient documentation

## 2017-06-02 DIAGNOSIS — M19041 Primary osteoarthritis, right hand: Secondary | ICD-10-CM

## 2017-06-02 DIAGNOSIS — J3089 Other allergic rhinitis: Secondary | ICD-10-CM | POA: Diagnosis not present

## 2017-06-02 DIAGNOSIS — F321 Major depressive disorder, single episode, moderate: Secondary | ICD-10-CM | POA: Diagnosis not present

## 2017-06-02 DIAGNOSIS — I10 Essential (primary) hypertension: Secondary | ICD-10-CM | POA: Diagnosis not present

## 2017-06-02 DIAGNOSIS — Z87442 Personal history of urinary calculi: Secondary | ICD-10-CM | POA: Diagnosis not present

## 2017-06-02 DIAGNOSIS — M19042 Primary osteoarthritis, left hand: Secondary | ICD-10-CM

## 2017-06-02 DIAGNOSIS — L439 Lichen planus, unspecified: Secondary | ICD-10-CM | POA: Diagnosis not present

## 2017-06-02 MED ORDER — ESCITALOPRAM OXALATE 10 MG PO TABS: 10.0000 mg | ORAL_TABLET | Freq: Every day | ORAL | 1 refills | Status: DC

## 2017-06-02 NOTE — Progress Notes (Signed)
Date:  06/02/2017   Name:  Emily Pena   DOB:  04-14-1951   MRN:  [id number]   Chief Complaint: Establish Care and Hypertension (Kept record of home BP's) Hypertension  This is a new problem. The current episode started more than 1 month ago. The problem has been gradually improving since onset. Pertinent negatives include no chest pain, headaches, palpitations or shortness of breath. Past treatments include diuretics. The current treatment provides moderate improvement. There is no history of kidney disease, CAD/MI or CVA.  Depression         This is a new problem.  The current episode started more than 1 month ago.   The onset quality is undetermined.   The problem occurs every several days.  The problem has been gradually worsening since onset.  Associated symptoms include fatigue, insomnia, restlessness and sad.  Associated symptoms include no decreased interest, no headaches and no suicidal ideas.     The symptoms are aggravated by work stress, family issues and social issues (children and husband have all noticed a change in her mood).  Past treatments include nothing.  Allergies - followed by ENT Dr. Elenore RotaJuengel, on singulair and flonase with good control.  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  Followed by Dr. Cheree DittoGraham.  Currently on no daily treatment.  Vitiligo - no specific treatment.  Seen by Dr. Cheree DittoGraham.  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  No active inflammation.  She is wondering what she can take to slow the progression of the sx.   Review of Systems  Constitutional: Positive for fatigue. Negative for chills, fever and unexpected weight change.  HENT: Negative for sinus pressure and trouble swallowing.   Respiratory: Negative for cough, chest tightness, shortness of breath and wheezing.   Cardiovascular: Negative for chest pain and palpitations.  Gastrointestinal: Negative for abdominal pain, constipation and  diarrhea.  Skin: Negative for rash and wound.  Allergic/Immunologic: Positive for environmental allergies.  Neurological: Negative for dizziness and headaches.  Psychiatric/Behavioral: Positive for depression, dysphoric mood and sleep disturbance. Negative for agitation and suicidal ideas. The patient has insomnia. The patient is not nervous/anxious.     Patient Active Problem List   Diagnosis Date Noted  . Essential hypertension 05/09/2013    Prior to Admission medications   Medication Sig Start Date End Date Taking? Authorizing Provider  fluticasone (FLONASE) 50 MCG/ACT nasal spray Place 2 sprays into both nostrils daily. 05/14/17   Tommie Samsook, Jayce G, DO  hydrochlorothiazide (HYDRODIURIL) 25 MG tablet Take 1 tablet (25 mg total) by mouth daily. 05/21/17   Tommie Samsook, Jayce G, DO  ibuprofen (ADVIL,MOTRIN) 600 MG tablet Take 1 tablet (600 mg total) every 6 (six) hours as needed by mouth. 03/13/17   Domenick GongMortenson, Ashley, MD    Allergies  Allergen Reactions  . Meperidine Nausea And Vomiting  . Propoxyphene Nausea And Vomiting    Past Surgical History:  Procedure Laterality Date  . CESAREAN SECTION  1985 &1991  . COLONOSCOPY  04/26/2007   [facility]- removed polyp  . NM MYOCAR PERF WALL MOTION  07/06/2007   no significant ischemia present  . US ECHOCARDIOGRAPHY  04/20/2007   normal  . vocal nodules  1978    Social History   Tobacco Use  . Smoking status: Never Smoker  . Smokeless tobacco: Never Used  Substance Use Topics  . Alcohol use: Yes    Alcohol/week: 0.6 - 1.2 oz  Types: 1 - 2 Standard drinks or equivalent per week  . Drug use: No     Medication list has been reviewed and updated.  PHQ 2/9 Scores 06/02/2017  PHQ - 2 Score 0    Physical Exam  Constitutional: She is oriented to person, place, and time. She appears well-developed. No distress.  HENT:  Head: Normocephalic and atraumatic.  Neck: Normal range of motion. Neck supple.  Cardiovascular: Normal rate, regular  rhythm and normal heart sounds.  Pulmonary/Chest: Effort normal. No respiratory distress. She has no wheezes.  Musculoskeletal: She exhibits no edema or tenderness.  OA changes of both hands  Neurological: She is alert and oriented to person, place, and time.  Skin: Skin is warm and dry. No rash noted.  Pigment changes noted on both hands c/w vitiligo  Psychiatric: She has a normal mood and affect. Her speech is normal and behavior is normal. Thought content normal. Cognition and memory are normal. She does not exhibit a depressed mood.  Nursing note and vitals reviewed.   BP (!) 132/92   Pulse 87   Ht 5\' 2"  (1.575 m)   Wt 164 lb (74.4 kg)   SpO2 96%   BMI 30.00 kg/m   Assessment and Plan: 1. Essential hypertension Improving Continue HCTZ and reassess at next visit  2. Current moderate episode of major depressive disorder without prior episode (HCC) Begin medications Follow up in 6 weeks - escitalopram (LEXAPRO) 10 MG tablet; Take 1 tablet (10 mg total) by mouth daily.  Dispense: 30 tablet; Refill: 1  3. Primary osteoarthritis of both hands Continue to use tylenol or advil as needed to maintain mobility  4. History of kidney stones  5. Vitiligo stable  6. Lichen planus Followed by Dr. Cheree Ditto   Meds ordered this encounter  Medications  . escitalopram (LEXAPRO) 10 MG tablet    Sig: Take 1 tablet (10 mg total) by mouth daily.    Dispense:  30 tablet    Refill:  1    Partially dictated using Animal nutritionist. Any errors are unintentional.  Bari Edward, MD [facility]  06/02/2017

## 2017-07-06 ENCOUNTER — Ambulatory Visit
Admission: EM | Admit: 2017-07-06 | Discharge: 2017-07-06 | Disposition: A | Discharge status: Home / Self Care | Payer: PRIVATE HEALTH INSURANCE | Attending: Family Medicine | Admitting: Family Medicine

## 2017-07-06 ENCOUNTER — Ambulatory Visit (INDEPENDENT_AMBULATORY_CARE_PROVIDER_SITE_OTHER): Payer: PRIVATE HEALTH INSURANCE

## 2017-07-06 ENCOUNTER — Encounter: Payer: Self-pay | Admitting: Emergency Medicine

## 2017-07-06 ENCOUNTER — Other Ambulatory Visit: Payer: Self-pay

## 2017-07-06 DIAGNOSIS — R059 Cough, unspecified: Secondary | ICD-10-CM

## 2017-07-06 DIAGNOSIS — J0100 Acute maxillary sinusitis, unspecified: Secondary | ICD-10-CM

## 2017-07-06 DIAGNOSIS — R062 Wheezing: Secondary | ICD-10-CM

## 2017-07-06 DIAGNOSIS — R05 Cough: Secondary | ICD-10-CM

## 2017-07-06 DIAGNOSIS — R030 Elevated blood-pressure reading, without diagnosis of hypertension: Secondary | ICD-10-CM | POA: Diagnosis not present

## 2017-07-06 MED ORDER — HYDROCOD POLST-CPM POLST ER 10-8 MG/5ML PO SUER: 5.0000 mL | Freq: Every evening | ORAL | 0 refills | Status: DC | PRN

## 2017-07-06 MED ORDER — IPRATROPIUM-ALBUTEROL 0.5-2.5 (3) MG/3ML IN SOLN
3.0000 mL | Freq: Once | RESPIRATORY_TRACT | Status: AC
  Administered 2017-07-06: 3 mL via RESPIRATORY_TRACT

## 2017-07-06 MED ORDER — PREDNISONE 10 MG PO TABS: ORAL_TABLET | ORAL | 0 refills | Status: DC

## 2017-07-06 MED ORDER — DOXYCYCLINE HYCLATE 100 MG PO CAPS: 100.0000 mg | ORAL_CAPSULE | Freq: Two times a day (BID) | ORAL | 0 refills | Status: DC

## 2017-07-06 MED ORDER — BENZONATATE 100 MG PO CAPS: 100.0000 mg | ORAL_CAPSULE | Freq: Three times a day (TID) | ORAL | 0 refills | Status: DC | PRN

## 2017-07-06 NOTE — ED Provider Notes (Signed)
[facility] ____________________________________________  Time seen: Approximately 7:35 PM  I have reviewed the triage vital signs and the nursing notes.   HISTORY  Chief Complaint Cough   HPI Emily Pena is a 67 y.o. female presenting for evaluation of one-week drainage, nasal congestion, cough and chest congestion.  Reports she has been intermittently hearing herself wheeze.  States initially felt warm but denies known fevers.  Has tried over-the-counter Mucinex DM and Zyrtec without much change.  States no sick contacts, some work sick contacts.  Denies chest pain or shortness of breath, but reports some tightness in chest associated with wheezing.  Has continued to remain active.  No antipyretics taken prior to arrival.  Reports similar presentation in January.  States does have a history of seasonal allergies and thought that initially maybe she was having allergies, but reports continued chest congestion.  States does have some sinus pressure around her cheekbones.  Patient reports also she has been following the primary care for blood pressure management and has a follow-up in 2 more weeks, currently taking HCTZ and has been monitoring at home.Denies chest pain, shortness of breath, abdominal pain, dysuria, extremity pain, extremity swelling or rash.  Denies diabetes, cardiac history or renal insufficiency.  Reubin Milan, MD: PCP    Past Medical History:  Diagnosis Date  . Arthritis    Joints arthritis.  Marland Kitchen History of kidney stones   . Hyperlipidemia   . Hypertension     Patient Active Problem List   Diagnosis Date Noted  . Current moderate episode of major depressive disorder without prior episode (HCC) 06/02/2017  . Primary osteoarthritis of both hands 06/02/2017  . History of kidney stones 06/02/2017  . Vitiligo 06/02/2017  . Lichen planus 06/02/2017  . Environmental and seasonal allergies 06/02/2017  . Essential hypertension 05/09/2013     Past Surgical History:  Procedure Laterality Date  . CESAREAN SECTION  1985 &1991  . COLONOSCOPY  04/26/2007   [facility]- removed polyp  . NM MYOCAR PERF WALL MOTION  07/06/2007   no significant ischemia present  . US ECHOCARDIOGRAPHY  04/20/2007   normal  . vocal nodules  1978     No current facility-administered medications for this encounter.   Current Outpatient Medications:  .  albuterol (PROVENTIL HFA;VENTOLIN HFA) 108 (90 Base) MCG/ACT inhaler, Inhale into the lungs every 6 (six) hours as needed for wheezing or shortness of breath., Disp: , Rfl:  .  escitalopram (LEXAPRO) 10 MG tablet, Take 1 tablet (10 mg total) by mouth daily., Disp: 30 tablet, Rfl: 1 .  hydrochlorothiazide (HYDRODIURIL) 25 MG tablet, Take 1 tablet (25 mg total) by mouth daily., Disp: 90 tablet, Rfl: 0 .  montelukast (SINGULAIR) 10 MG tablet, Take 10 mg by mouth at bedtime., Disp: , Rfl:  .  naproxen sodium (ALEVE) 220 MG tablet, Take 220 mg by mouth., Disp: , Rfl:  .  benzonatate (TESSALON PERLES) 100 MG capsule, Take 1 capsule (100 mg total) by mouth 3 (three) times daily as needed for cough., Disp: 15 capsule, Rfl: 0 .  chlorpheniramine-HYDROcodone (TUSSIONEX PENNKINETIC ER) 10-8 MG/5ML SUER, Take 5 mLs by mouth at bedtime as needed for cough. do not drive or operate machinery while taking as can cause drowsiness., Disp: 75 mL, Rfl: 0 .  doxycycline (VIBRAMYCIN) 100 MG capsule, Take 1 capsule (100 mg total) by mouth 2 (two) times daily., Disp: 20 capsule, Rfl: 0 .  predniSONE (DELTASONE) 10 MG tablet, Start 60 mg po day one,  then 50 mg po day two, taper by 10 mg daily until complete., Disp: 21 tablet, Rfl: 0  Allergies Meperidine and Propoxyphene  Family History  Problem Relation Age of Onset  . Hypertension Mother   . Heart failure Father   . Aneurysm Maternal Grandfather   . Heart attack Paternal Grandfather   . Cancer Brother     Social History Social History   Tobacco Use  . Smoking  status: Never Smoker  . Smokeless tobacco: Never Used  Substance Use Topics  . Alcohol use: Yes    Alcohol/week: 0.6 - 1.2 oz    Types: 1 - 2 Standard drinks or equivalent per week  . Drug use: No    Review of Systems Constitutional: No fever/chills ENT: No sore throat. Cardiovascular: Denies chest pain. Respiratory: Denies shortness of breath. As above.  Gastrointestinal: No abdominal pain.  No nausea, no vomiting.  No diarrhea.  Genitourinary: Negative for dysuria. Musculoskeletal: Negative for back pain. Skin: Negative for rash.  ____________________________________________   PHYSICAL EXAM:  VITAL SIGNS: ED Triage Vitals  Enc Vitals Group     BP 07/06/17 1843 (!) 148/130     Pulse Rate 07/06/17 1843 85     Resp 07/06/17 1843 16     Temp 07/06/17 1843 97.8 F (36.6 C)     Temp Source 07/06/17 1843 Oral     SpO2 07/06/17 1843 97 %     Weight 07/06/17 1845 160 lb (72.6 kg)     Height 07/06/17 1845 5\' 2"  (1.575 m)     Head Circumference --      Peak Flow --      Pain Score 07/06/17 1845 0     Pain Loc --      Pain Edu? --      Excl. in GC? --     Vitals:   07/06/17 1843 07/06/17 1845 07/06/17 1905  BP: (!) 148/130  (!) 150/89  Pulse: 85    Resp: 16    Temp: 97.8 F (36.6 C)    TempSrc: Oral    SpO2: 97%    Weight:  160 lb (72.6 kg)   Height:  5\' 2"  (1.575 m)     Constitutional: Alert and oriented. Well appearing and in no acute distress. Eyes: Conjunctivae are normal.  ENT      Head: Normocephalic and atraumatic.      Ears: nontender, no erythema, normal TMs bilaterally.      Nose: Nasal congestion      Mouth/Throat: Mucous membranes are moist.Oropharynx non-erythematous.  No tonsillar swelling or exudate Neck: No stridor. Supple without meningismus.  Hematological/Lymphatic/Immunilogical: No cervical lymphadenopathy. Cardiovascular: Normal rate, regular rhythm. Grossly normal heart sounds.  Good peripheral circulation. Respiratory: Normal respiratory  effort without tachypnea nor retractions.  Mild scattered wheezes.  No rhonchi or rales.  Dry intermittent cough noted in room with bronchospasm.  Speaks in complete sentences. Gastrointestinal: Soft and nontender. No distention. Normal Bowel sounds. No CVA tenderness. Musculoskeletal:  No midline cervical, thoracic or lumbar tenderness to palpation.  Steady gait. Neurologic:  Normal speech and language.Speech is normal. No gait instability.  Skin:  Skin is warm, dry and intact. No rash noted. Psychiatric: Mood and affect are normal. Speech and behavior are normal. Patient exhibits appropriate insight and judgment   ___________________________________________   LABS (all labs ordered are listed, but only abnormal results are displayed)  Labs Reviewed - No data to display ____________________________________________  RADIOLOGY  No results found. ____________________________________________  PROCEDURES Procedures   INITIAL IMPRESSION / ASSESSMENT AND PLAN / ED COURSE  Pertinent labs & imaging results that were available during my care of the patient were reviewed by me and considered in my medical decision making (see chart for details).  Well-appearing patient.  No acute distress.  Suspect recent viral upper respiratory infection with secondary bronchospasm and wheezing.  Also concern for secondary maxillary sinusitis.  As wheezes present currently with recent history of similar, chest x-ray evaluated.  Chest x-ray no active cardiopulmonary disease as above per radiologist.  One DuoNeb given in urgent care and post DuoNeb wheezes improved and patient reports feeling better.  Will treat patient with oral prednisone taper, doxycycline, as needed Tessalon Perles and as needed Tussionex.  Patient has home albuterol inhaler, instructed to use 3-4 times per day.  Discussed follow-up and return parameters.  Also continue monitoring blood pressure and keeping journal and follow-up with primary  care as planned.Discussed indication, risks and benefits of medications with patient.  Discussed follow up with Primary care physician this week. Discussed follow up and return parameters including no resolution or any worsening concerns. Patient verbalized understanding and agreed to plan.   ____________________________________________   FINAL CLINICAL IMPRESSION(S) / ED DIAGNOSES  Final diagnoses:  Cough  Acute maxillary sinusitis, recurrence not specified  Wheezing  Elevated blood pressure reading     ED Discharge Orders        Ordered    predniSONE (DELTASONE) 10 MG tablet     07/06/17 2022    doxycycline (VIBRAMYCIN) 100 MG capsule  2 times daily     07/06/17 2022    chlorpheniramine-HYDROcodone (TUSSIONEX PENNKINETIC ER) 10-8 MG/5ML SUER  At bedtime PRN     07/06/17 2022    benzonatate (TESSALON PERLES) 100 MG capsule  3 times daily PRN     07/06/17 2022       Note: This dictation was prepared with Dragon dictation along with smaller phrase technology. Any transcriptional errors that result from this process are unintentional.         Renford Dills, NP 07/08/17 1029

## 2017-07-06 NOTE — Discharge Instructions (Signed)
Take medication as prescribed. Rest. Drink plenty of fluids.  ° °Follow up with your primary care physician as discussed. Return to Urgent care for new or worsening concerns.  ° °

## 2017-07-06 NOTE — ED Triage Notes (Signed)
Patient in today c/o cough x 1 week. Patient denies fever, but has been sweating. She has not checked her temperature. Patient has tried OTC Mucinex DM and Zyrtec.

## 2017-07-20 ENCOUNTER — Ambulatory Visit: Payer: PRIVATE HEALTH INSURANCE | Admitting: Internal Medicine

## 2017-07-24 ENCOUNTER — Encounter: Payer: Self-pay | Admitting: Internal Medicine

## 2017-07-24 ENCOUNTER — Ambulatory Visit (INDEPENDENT_AMBULATORY_CARE_PROVIDER_SITE_OTHER): Payer: PRIVATE HEALTH INSURANCE | Admitting: Internal Medicine

## 2017-07-24 VITALS — BP 148/86 | HR 95 | Temp 98.0°F | Ht 62.0 in | Wt 166.0 lb

## 2017-07-24 DIAGNOSIS — I10 Essential (primary) hypertension: Secondary | ICD-10-CM | POA: Diagnosis not present

## 2017-07-24 DIAGNOSIS — F321 Major depressive disorder, single episode, moderate: Secondary | ICD-10-CM

## 2017-07-24 DIAGNOSIS — R05 Cough: Secondary | ICD-10-CM | POA: Diagnosis not present

## 2017-07-24 DIAGNOSIS — R059 Cough, unspecified: Secondary | ICD-10-CM

## 2017-07-24 MED ORDER — CEFDINIR 300 MG PO CAPS: 300.0000 mg | ORAL_CAPSULE | Freq: Two times a day (BID) | ORAL | 0 refills | Status: DC

## 2017-07-24 MED ORDER — LISINOPRIL-HYDROCHLOROTHIAZIDE 20-25 MG PO TABS: 1.0000 | ORAL_TABLET | Freq: Every day | ORAL | 5 refills | Status: DC

## 2017-07-24 MED ORDER — ESCITALOPRAM OXALATE 10 MG PO TABS: 10.0000 mg | ORAL_TABLET | Freq: Every day | ORAL | 5 refills | Status: DC

## 2017-07-24 MED ORDER — MONTELUKAST SODIUM 10 MG PO TABS: 10.0000 mg | ORAL_TABLET | Freq: Every day | ORAL | 5 refills | Status: DC

## 2017-07-24 NOTE — Progress Notes (Signed)
Date:  07/24/2017   Name:  Emily Pena   DOB:  10-14-1950   MRN:  [id number]   Chief Complaint: Depression; Hypertension (Generally BP is staying in 150s over high 80s to 95); and Cough (Coughing since this weekend from keeping granddaughter. Cough is getting worse. Taste mucus in back of throat but unsure of color. No fever. ) Hypertension  This is a chronic problem. The problem has been gradually improving since onset. The problem is resistant (at home 155/90 average). Pertinent negatives include no chest pain, headaches, palpitations or shortness of breath. Past treatments include diuretics (restarted last visit).  Depression         This is a new problem.  The current episode started more than 1 month ago.   The problem has been gradually improving since onset.  Associated symptoms include no helplessness, no hopelessness, no decreased interest, no headaches and no suicidal ideas.  Past treatments include SSRIs - Selective serotonin reuptake inhibitors (started last visit). Cough  This is a recurrent problem. The current episode started in the past 7 days. The problem has been gradually worsening. The cough is non-productive. Associated symptoms include wheezing. Pertinent negatives include no chest pain, chills, fever, headaches or shortness of breath. Her past medical history is significant for environmental allergies. There is no history of asthma or COPD.  She had Zpak in January, then Doxy, prednisone taper in February. Now coughing again.  Not taking any cough suppressants. Not taking her singulair.    Review of Systems  Constitutional: Negative for chills and fever.  Respiratory: Positive for cough and wheezing. Negative for chest tightness and shortness of breath.   Cardiovascular: Negative for chest pain and palpitations.  Allergic/Immunologic: Positive for environmental allergies.  Neurological: Negative for dizziness and headaches.  Psychiatric/Behavioral: Positive for  depression. Negative for suicidal ideas.    Patient Active Problem List   Diagnosis Date Noted  . Current moderate episode of major depressive disorder without prior episode (HCC) 06/02/2017  . Primary osteoarthritis of both hands 06/02/2017  . History of kidney stones 06/02/2017  . Vitiligo 06/02/2017  . Lichen planus 06/02/2017  . Environmental and seasonal allergies 06/02/2017  . Essential hypertension 05/09/2013    Prior to Admission medications   Medication Sig Start Date End Date Taking? Authorizing Provider  escitalopram (LEXAPRO) 10 MG tablet Take 1 tablet (10 mg total) by mouth daily. 06/02/17  Yes Reubin Milan, MD  hydrochlorothiazide (HYDRODIURIL) 25 MG tablet Take 1 tablet (25 mg total) by mouth daily. 05/21/17  Yes Cook, Jayce G, DO  montelukast (SINGULAIR) 10 MG tablet Take 10 mg by mouth at bedtime.   Yes [provider]  naproxen sodium (ALEVE) 220 MG tablet Take 220 mg by mouth.   Yes [provider]    Allergies  Allergen Reactions  . Meperidine Nausea And Vomiting  . Propoxyphene Nausea And Vomiting    Past Surgical History:  Procedure Laterality Date  . CESAREAN SECTION  1985 &1991  . COLONOSCOPY  04/26/2007   [facility]- removed polyp  . NM MYOCAR PERF WALL MOTION  07/06/2007   no significant ischemia present  . US ECHOCARDIOGRAPHY  04/20/2007   normal  . vocal nodules  1978    Social History   Tobacco Use  . Smoking status: Never Smoker  . Smokeless tobacco: Never Used  Substance Use Topics  . Alcohol use: Yes    Alcohol/week: 0.6 - 1.2 oz    Types: 1 - 2  Standard drinks or equivalent per week  . Drug use: No     Medication list has been reviewed and updated.  PHQ 2/9 Scores 07/24/2017 06/02/2017  PHQ - 2 Score 0 0  PHQ- 9 Score 2 -      Physical Exam  Constitutional: She is oriented to person, place, and time. She appears well-developed. No distress.  HENT:  Head: Normocephalic and atraumatic.  Neck: Normal range  of motion. Neck supple.  Cardiovascular: Normal rate, regular rhythm, normal heart sounds and intact distal pulses.  Pulmonary/Chest: Effort normal and breath sounds normal. No respiratory distress. She has no wheezes. She has no rales.  Musculoskeletal: Normal range of motion.  Neurological: She is alert and oriented to person, place, and time.  Skin: Skin is warm and dry. No rash noted.  Psychiatric: She has a normal mood and affect. Her speech is normal and behavior is normal. Thought content normal.  Nursing note and vitals reviewed.   BP (!) 158/94   Pulse 95   Temp 98 F (36.7 C) (Oral)   Ht 5\' 2"  (1.575 m)   Wt 166 lb (75.3 kg)   SpO2 95%   BMI 30.36 kg/m   Assessment and Plan: 1. Essential hypertension Change hctz to lisinopril hct - lisinopril-hydrochlorothiazide (PRINZIDE,ZESTORETIC) 20-25 MG tablet; Take 1 tablet by mouth daily.  Dispense: 30 tablet; Refill: 5  2. Current moderate episode of major depressive disorder without prior episode (HCC) Continue lexapro - escitalopram (LEXAPRO) 10 MG tablet; Take 1 tablet (10 mg total) by mouth daily.  Dispense: 30 tablet; Refill: 5  3. Cough Suspect viral Resume singulair Take antibiotics only if sx worsen - cefdinir (OMNICEF) 300 MG capsule; Take 1 capsule (300 mg total) by mouth 2 (two) times daily.  Dispense: 20 capsule; Refill: 0 - montelukast (SINGULAIR) 10 MG tablet; Take 1 tablet (10 mg total) by mouth at bedtime.  Dispense: 30 tablet; Refill: 5   Meds ordered this encounter  Medications  . cefdinir (OMNICEF) 300 MG capsule    Sig: Take 1 capsule (300 mg total) by mouth 2 (two) times daily.    Dispense:  20 capsule    Refill:  0  . montelukast (SINGULAIR) 10 MG tablet    Sig: Take 1 tablet (10 mg total) by mouth at bedtime.    Dispense:  30 tablet    Refill:  5  . escitalopram (LEXAPRO) 10 MG tablet    Sig: Take 1 tablet (10 mg total) by mouth daily.    Dispense:  30 tablet    Refill:  5  .  lisinopril-hydrochlorothiazide (PRINZIDE,ZESTORETIC) 20-25 MG tablet    Sig: Take 1 tablet by mouth daily.    Dispense:  30 tablet    Refill:  5    Partially dictated using Animal nutritionistDragon software. Any errors are unintentional.  Bari EdwardLaura Siarra Gilkerson, MD [facility]  07/24/2017

## 2017-07-27 ENCOUNTER — Other Ambulatory Visit: Payer: Self-pay

## 2017-07-27 ENCOUNTER — Encounter: Payer: Self-pay | Admitting: Emergency Medicine

## 2017-07-27 ENCOUNTER — Ambulatory Visit
Admission: EM | Admit: 2017-07-27 | Discharge: 2017-07-27 | Disposition: A | Discharge status: Home / Self Care | Payer: PRIVATE HEALTH INSURANCE | Attending: Family Medicine | Admitting: Family Medicine

## 2017-07-27 DIAGNOSIS — R059 Cough, unspecified: Secondary | ICD-10-CM

## 2017-07-27 DIAGNOSIS — R05 Cough: Secondary | ICD-10-CM | POA: Diagnosis not present

## 2017-07-27 DIAGNOSIS — R062 Wheezing: Secondary | ICD-10-CM

## 2017-07-27 MED ORDER — PREDNISONE 20 MG PO TABS: ORAL_TABLET | ORAL | 0 refills | Status: DC

## 2017-07-27 NOTE — ED Provider Notes (Signed)
[facility]    CSN: [id number] Arrival date & time: 07/27/17  1835     History   Chief Complaint Chief Complaint  Patient presents with  . Cough    HPI Emily Pena is a 67 y.o. female.   The history is provided by the patient.  Cough  Cough characteristics:  Non-productive Severity:  Severe Onset quality:  Sudden Duration:  2 weeks Timing:  Constant Progression:  Unchanged Chronicity:  New Smoker: no   Context: upper respiratory infection   Relieved by:  Nothing Ineffective treatments:  Beta-agonist inhaler, rest, steam and cough suppressants Associated symptoms: wheezing (mild)   Associated symptoms: no chest pain, no chills, no diaphoresis, no ear fullness, no ear pain, no eye discharge, no fever, no headaches, no myalgias, no rash, no rhinorrhea, no shortness of breath, no sinus congestion, no sore throat and no weight loss   Risk factors: recent infection     Past Medical History:  Diagnosis Date  . Arthritis    Joints arthritis.  Marland Kitchen History of kidney stones   . Hyperlipidemia   . Hypertension     Patient Active Problem List   Diagnosis Date Noted  . Current moderate episode of major depressive disorder without prior episode (HCC) 06/02/2017  . Primary osteoarthritis of both hands 06/02/2017  . History of kidney stones 06/02/2017  . Vitiligo 06/02/2017  . Lichen planus 06/02/2017  . Environmental and seasonal allergies 06/02/2017  . Essential hypertension 05/09/2013    Past Surgical History:  Procedure Laterality Date  . CESAREAN SECTION  1985 &1991  . COLONOSCOPY  04/26/2007   [facility]- removed polyp  . NM MYOCAR PERF WALL MOTION  07/06/2007   no significant ischemia present  . US ECHOCARDIOGRAPHY  04/20/2007   normal  . vocal nodules  1978    OB History   None      Home Medications    Prior to Admission medications   Medication Sig Start Date End Date Taking? Authorizing Provider  escitalopram (LEXAPRO) 10 MG  tablet Take 1 tablet (10 mg total) by mouth daily. 07/24/17  Yes Reubin Milan, MD  lisinopril-hydrochlorothiazide (PRINZIDE,ZESTORETIC) 20-25 MG tablet Take 1 tablet by mouth daily. 07/24/17  Yes Reubin Milan, MD  montelukast (SINGULAIR) 10 MG tablet Take 1 tablet (10 mg total) by mouth at bedtime. 07/24/17  Yes Reubin Milan, MD  cefdinir (OMNICEF) 300 MG capsule Take 1 capsule (300 mg total) by mouth 2 (two) times daily. 07/24/17   Reubin Milan, MD  naproxen sodium (ALEVE) 220 MG tablet Take 220 mg by mouth.    [provider]  predniSONE (DELTASONE) 20 MG tablet 3 tabs po once day 1, then 2 tabs po qd x 2 days, then 1 tab po qd x 2 days, then half a tab po qd x 2 days 07/27/17   Payton Mccallum, MD    Family History Family History  Problem Relation Age of Onset  . Hypertension Mother   . Heart failure Father   . Aneurysm Maternal Grandfather   . Heart attack Paternal Grandfather   . Cancer Brother     Social History Social History   Tobacco Use  . Smoking status: Never Smoker  . Smokeless tobacco: Never Used  Substance Use Topics  . Alcohol use: Yes    Alcohol/week: 0.6 - 1.2 oz    Types: 1 - 2 Standard drinks or equivalent per week  . Drug use: No  Allergies   Meperidine and Propoxyphene   Review of Systems Review of Systems  Constitutional: Negative for chills, diaphoresis, fever and weight loss.  HENT: Negative for ear pain, rhinorrhea and sore throat.   Eyes: Negative for discharge.  Respiratory: Positive for cough and wheezing (mild). Negative for shortness of breath.   Cardiovascular: Negative for chest pain.  Musculoskeletal: Negative for myalgias.  Skin: Negative for rash.  Neurological: Negative for headaches.     Physical Exam Triage Vital Signs ED Triage Vitals [07/27/17 1843]  Enc Vitals Group     BP 134/63     Pulse Rate 88     Resp 16     Temp 98.2 F (36.8 C)     Temp Source Oral     SpO2 97 %     Weight 166 lb  (75.3 kg)     Height 5\' 2"  (1.575 m)     Head Circumference      Peak Flow      Pain Score 4     Pain Loc      Pain Edu?      Excl. in GC?    No data found.  Updated Vital Signs BP 134/63 (BP Location: Left Arm)   Pulse 88   Temp 98.2 F (36.8 C) (Oral)   Resp 16   Ht 5\' 2"  (1.575 m)   Wt 166 lb (75.3 kg)   SpO2 97%   BMI 30.36 kg/m   Visual Acuity Right Eye Distance:   Left Eye Distance:   Bilateral Distance:    Right Eye Near:   Left Eye Near:    Bilateral Near:     Physical Exam  Constitutional: She appears well-developed and well-nourished.  Non-toxic appearance. No distress.  HENT:  Head: Normocephalic and atraumatic.  Right Ear: Tympanic membrane, external ear and ear canal normal.  Left Ear: Tympanic membrane, external ear and ear canal normal.  Nose: Mucosal edema and rhinorrhea present. No nose lacerations, sinus tenderness, nasal deformity, septal deviation or nasal septal hematoma. No epistaxis.  No foreign bodies. Right sinus exhibits no maxillary sinus tenderness and no frontal sinus tenderness. Left sinus exhibits no maxillary sinus tenderness and no frontal sinus tenderness.  Mouth/Throat: Uvula is midline, oropharynx is clear and moist and mucous membranes are normal. No oropharyngeal exudate.  Eyes: Pupils are equal, round, and reactive to light. Conjunctivae and EOM are normal. Right eye exhibits no discharge. Left eye exhibits no discharge. No scleral icterus.  Neck: Normal range of motion. Neck supple. No thyromegaly present.  Cardiovascular: Normal rate, regular rhythm and normal heart sounds.  Pulmonary/Chest: Effort normal and breath sounds normal. No respiratory distress. She has no wheezes. She has no rales.  Lymphadenopathy:    She has no cervical adenopathy.  Skin: She is not diaphoretic.  Nursing note and vitals reviewed.    UC Treatments / Results  Labs (all labs ordered are listed, but only abnormal results are displayed) Labs  Reviewed - No data to display  EKG None Radiology No results found.  Procedures Procedures (including critical care time)  Medications Ordered in UC Medications - No data to display   Initial Impression / Assessment and Plan / UC Course  I have reviewed the triage vital signs and the nursing notes.  Pertinent labs & imaging results that were available during my care of the patient were reviewed by me and considered in my medical decision making (see chart for details).      Final  Clinical Impressions(s) / UC Diagnoses   Final diagnoses:  Cough    ED Discharge Orders        Ordered    predniSONE (DELTASONE) 20 MG tablet     07/27/17 1925     1. diagnosis reviewed with patient 2. rx as per orders above; reviewed possible side effects, interactions, risks and benefits  3. Recommend supportive treatment with continue albuterol inhaler prn 4. Follow-up prn if symptoms worsen or don't improve  Controlled Substance Prescriptions [place] Controlled Substance Registry consulted? Not Applicable   Payton Mccallumonty, Aidenn Skellenger, MD 07/27/17 1944

## 2017-07-27 NOTE — ED Triage Notes (Signed)
Patient in today c/o cough x 2-3 weeks. Patient saw PCP last week and was given Singulair.

## 2017-12-07 ENCOUNTER — Ambulatory Visit (INDEPENDENT_AMBULATORY_CARE_PROVIDER_SITE_OTHER): Payer: PRIVATE HEALTH INSURANCE | Admitting: Internal Medicine

## 2017-12-07 ENCOUNTER — Encounter: Payer: Self-pay | Admitting: Internal Medicine

## 2017-12-07 VITALS — BP 132/84 | HR 71 | Resp 16 | Ht 62.0 in | Wt 171.0 lb

## 2017-12-07 DIAGNOSIS — I10 Essential (primary) hypertension: Secondary | ICD-10-CM

## 2017-12-07 DIAGNOSIS — M1991 Primary osteoarthritis, unspecified site: Secondary | ICD-10-CM | POA: Insufficient documentation

## 2017-12-07 DIAGNOSIS — Z1159 Encounter for screening for other viral diseases: Secondary | ICD-10-CM

## 2017-12-07 DIAGNOSIS — F321 Major depressive disorder, single episode, moderate: Secondary | ICD-10-CM | POA: Diagnosis not present

## 2017-12-07 DIAGNOSIS — Z23 Encounter for immunization: Secondary | ICD-10-CM | POA: Diagnosis not present

## 2017-12-07 DIAGNOSIS — Z1211 Encounter for screening for malignant neoplasm of colon: Secondary | ICD-10-CM

## 2017-12-07 MED ORDER — LISINOPRIL-HYDROCHLOROTHIAZIDE 20-25 MG PO TABS: 1.0000 | ORAL_TABLET | Freq: Every day | ORAL | 5 refills | Status: DC

## 2017-12-07 MED ORDER — ESCITALOPRAM OXALATE 10 MG PO TABS: 10.0000 mg | ORAL_TABLET | Freq: Every day | ORAL | 5 refills | Status: DC

## 2017-12-07 NOTE — Progress Notes (Signed)
Date:  12/07/2017   Name:  Emily MaskerSuzanne James Pena   DOB:  22-Jul-1950   MRN:  [id number]   Chief Complaint: Hypertension (4 month f/up.) and Depression (Been taking lexapro. Wonders if need to up dose because she somtimes feels discouraged. )  Hypertension  This is a chronic problem. The problem has been gradually improving since onset. The problem is controlled. Pertinent negatives include no chest pain, headaches, palpitations or shortness of breath. Past treatments include ACE inhibitors and diuretics. The current treatment provides significant improvement.  Depression         This is a chronic problem.  The problem occurs rarely.  Associated symptoms include no fatigue and no headaches.     The symptoms are aggravated by work stress and family issues.  Past treatments include SSRIs - Selective serotonin reuptake inhibitors.  Compliance with treatment is good.  Previous treatment provided significant relief. OA - has stiffness in knees, hands, hips. Takes Aleve as needed.  HM -  Se[place] GYN in [place].  Due for mammogram, has pap yearly.  DEXA done in the past few years. We will fax request for reports.  Review of Systems  Constitutional: Negative for chills, fatigue and fever.  Eyes: Negative for visual disturbance.  Respiratory: Negative for cough, chest tightness and shortness of breath.   Cardiovascular: Negative for chest pain, palpitations and leg swelling.  Gastrointestinal: Negative for abdominal pain, constipation and diarrhea.  Musculoskeletal: Positive for arthralgias (hands and knees).  Skin: Positive for color change (due to vitiligo - gradually worsening).  Neurological: Negative for dizziness, light-headedness and headaches.  Psychiatric/Behavioral: Positive for depression. Negative for dysphoric mood and sleep disturbance. The patient is not nervous/anxious.     Patient Active Problem List   Diagnosis Date Noted  . Current moderate episode of major depressive disorder  without prior episode (HCC) 06/02/2017  . Primary osteoarthritis of both hands 06/02/2017  . History of kidney stones 06/02/2017  . Vitiligo 06/02/2017  . Lichen planus 06/02/2017  . Environmental and seasonal allergies 06/02/2017  . Essential hypertension 05/09/2013    Allergies  Allergen Reactions  . Meperidine Nausea And Vomiting  . Propoxyphene Nausea And Vomiting    Past Surgical History:  Procedure Laterality Date  . CESAREAN SECTION  1985 &1991  . COLONOSCOPY  04/26/2007   [facility]- removed polyp  . NM MYOCAR PERF WALL MOTION  07/06/2007   no significant ischemia present  . US ECHOCARDIOGRAPHY  04/20/2007   normal  . vocal nodules  1978    Social History   Tobacco Use  . Smoking status: Never Smoker  . Smokeless tobacco: Never Used  Substance Use Topics  . Alcohol use: Yes    Alcohol/week: 1.0 - 2.0 standard drinks    Types: 1 - 2 Standard drinks or equivalent per week  . Drug use: No     Medication list has been reviewed and updated.  Current Meds  Medication Sig  . escitalopram (LEXAPRO) 10 MG tablet Take 1 tablet (10 mg total) by mouth daily.  . fluocinonide gel (LIDEX) 0.05 % Apply 1 application topically 2 (two) times daily.  Marland Kitchen. ketoconazole (NIZORAL) 2 % cream Apply 1 application topically daily.  Marland Kitchen. lisinopril-hydrochlorothiazide (PRINZIDE,ZESTORETIC) 20-25 MG tablet Take 1 tablet by mouth daily.  . montelukast (SINGULAIR) 10 MG tablet Take 1 tablet (10 mg total) by mouth at bedtime.  . naproxen sodium (ALEVE) 220 MG tablet Take 220 mg by mouth.  . pimecrolimus (ELIDEL) 1 % cream  Apply topically 2 (two) times daily.    PHQ 2/9 Scores 12/07/2017 12/07/2017 07/24/2017 06/02/2017  PHQ - 2 Score 2 0 0 0  PHQ- 9 Score 2 0 2 -    Physical Exam  Constitutional: She is oriented to person, place, and time. She appears well-developed. No distress.  HENT:  Head: Normocephalic and atraumatic.  Neck: Normal range of motion. Neck supple.  Cardiovascular:  Normal rate, regular rhythm and normal heart sounds.  Pulmonary/Chest: Effort normal and breath sounds normal. No respiratory distress.  Musculoskeletal: Normal range of motion.       Right knee: She exhibits normal range of motion and no swelling.       Left knee: She exhibits normal range of motion and no swelling.  Crepitus both knees bouchards and heberdens nodes fingers bilaterally  Neurological: She is alert and oriented to person, place, and time.  Skin: Skin is warm and dry. No rash noted.  Psychiatric: She has a normal mood and affect. Her behavior is normal. Thought content normal.  Nursing note and vitals reviewed.   BP 136/82 (BP Location: Right Arm, Patient Position: Sitting, Cuff Size: Normal)   Pulse 71   Resp 16   Ht 5\' 2"  (1.575 m)   Wt 171 lb (77.6 kg)   SpO2 97%   BMI 31.28 kg/m   Assessment and Plan: 1. Essential hypertension Controlled, continue current therapy - lisinopril-hydrochlorothiazide (PRINZIDE,ZESTORETIC) 20-25 MG tablet; Take 1 tablet by mouth daily.  Dispense: 30 tablet; Refill: 5 - CBC with Differential/Platelet - Comprehensive metabolic panel - TSH  2. Current moderate episode of major depressive disorder without prior episode (HCC) Doing well on medication - escitalopram (LEXAPRO) 10 MG tablet; Take 1 tablet (10 mg total) by mouth daily.  Dispense: 30 tablet; Refill: 5  3. Need for pneumococcal vaccination - Pneumococcal polysaccharide vaccine 23-valent greater than or equal to 2yo subcutaneous/IM  4. Colon cancer screening Due for screening colonoscopy - Ambulatory referral to Gastroenterology  5. Need for hepatitis C screening test - Hepatitis C antibody  6. Primary osteoarthritis, unspecified site Recommend Aleve as needed   Meds ordered this encounter  Medications  . escitalopram (LEXAPRO) 10 MG tablet    Sig: Take 1 tablet (10 mg total) by mouth daily.    Dispense:  30 tablet    Refill:  5  . lisinopril-hydrochlorothiazide  (PRINZIDE,ZESTORETIC) 20-25 MG tablet    Sig: Take 1 tablet by mouth daily.    Dispense:  30 tablet    Refill:  5    Partially dictated using Animal nutritionistDragon software. Any errors are unintentional.  Bari EdwardLaura Onelia Cadmus, MD [facility]  12/07/2017

## 2017-12-08 LAB — COMPREHENSIVE METABOLIC PANEL
ALK PHOS: 74 IU/L (ref 39–117)
ALT: 35 IU/L — ABNORMAL HIGH (ref 0–32)
AST: 23 IU/L (ref 0–40)
Albumin/Globulin Ratio: 2.2 (ref 1.2–2.2)
Albumin: 4.6 g/dL (ref 3.6–4.8)
BILIRUBIN TOTAL: 0.4 mg/dL (ref 0.0–1.2)
BUN/Creatinine Ratio: 21 (ref 12–28)
BUN: 16 mg/dL (ref 8–27)
CHLORIDE: 98 mmol/L (ref 96–106)
CO2: 23 mmol/L (ref 20–29)
Calcium: 9.9 mg/dL (ref 8.7–10.3)
Creatinine, Ser: 0.78 mg/dL (ref 0.57–1.00)
GFR calc non Af Amer: 80 mL/min/{1.73_m2} (ref >59)
GFR, EST AFRICAN AMERICAN: 92 mL/min/{1.73_m2} (ref >59)
GLUCOSE: 75 mg/dL (ref 65–99)
Globulin, Total: 2.1 g/dL (ref 1.5–4.5)
POTASSIUM: 3.9 mmol/L (ref 3.5–5.2)
Sodium: 140 mmol/L (ref 134–144)
Total Protein: 6.7 g/dL (ref 6.0–8.5)

## 2017-12-08 LAB — CBC WITH DIFFERENTIAL/PLATELET
BASOS ABS: 0.1 10*3/uL (ref 0.0–0.2)
BASOS: 1 %
EOS (ABSOLUTE): 0.7 10*3/uL — AB (ref 0.0–0.4)
Eos: 9 %
Hematocrit: 46 % (ref 34.0–46.6)
Hemoglobin: 15.5 g/dL (ref 11.1–15.9)
Immature Grans (Abs): 0 10*3/uL (ref 0.0–0.1)
Immature Granulocytes: 0 %
Lymphocytes Absolute: 2.2 10*3/uL (ref 0.7–3.1)
Lymphs: 27 %
MCH: 30.3 pg (ref 26.6–33.0)
MCHC: 33.7 g/dL (ref 31.5–35.7)
MCV: 90 fL (ref 79–97)
MONOS ABS: 0.8 10*3/uL (ref 0.1–0.9)
Monocytes: 10 %
NEUTROS ABS: 4.3 10*3/uL (ref 1.4–7.0)
Neutrophils: 53 %
PLATELETS: 305 10*3/uL (ref 150–450)
RBC: 5.11 x10E6/uL (ref 3.77–5.28)
RDW: 13 % (ref 12.3–15.4)
WBC: 8 10*3/uL (ref 3.4–10.8)

## 2017-12-08 LAB — TSH: TSH: 2.29 u[IU]/mL (ref 0.450–4.500)

## 2017-12-08 LAB — HEPATITIS C ANTIBODY

## 2017-12-21 ENCOUNTER — Encounter: Payer: Self-pay | Admitting: *Deleted

## 2017-12-23 ENCOUNTER — Telehealth: Payer: Self-pay | Admitting: Internal Medicine

## 2017-12-23 NOTE — Telephone Encounter (Signed)
Please let patient know that bone density is normal.  Would repeat in 3-5 years.

## 2017-12-23 NOTE — Telephone Encounter (Signed)
Patient informed of normal bone density.  

## 2018-04-24 ENCOUNTER — Encounter: Payer: Self-pay | Admitting: Gynecology

## 2018-04-24 ENCOUNTER — Other Ambulatory Visit: Payer: Self-pay

## 2018-04-24 ENCOUNTER — Ambulatory Visit
Admission: EM | Admit: 2018-04-24 | Discharge: 2018-04-24 | Disposition: A | Discharge status: Home / Self Care | Payer: PRIVATE HEALTH INSURANCE | Attending: Family Medicine | Admitting: Family Medicine

## 2018-04-24 DIAGNOSIS — R05 Cough: Secondary | ICD-10-CM | POA: Insufficient documentation

## 2018-04-24 DIAGNOSIS — R059 Cough, unspecified: Secondary | ICD-10-CM

## 2018-04-24 HISTORY — DX: Depression, unspecified: F32.A

## 2018-04-24 HISTORY — DX: Major depressive disorder, single episode, unspecified: F32.9

## 2018-04-24 MED ORDER — DOXYCYCLINE HYCLATE 100 MG PO CAPS: 100.0000 mg | ORAL_CAPSULE | Freq: Two times a day (BID) | ORAL | 0 refills | Status: DC

## 2018-04-24 MED ORDER — BENZONATATE 100 MG PO CAPS: 100.0000 mg | ORAL_CAPSULE | Freq: Three times a day (TID) | ORAL | 0 refills | Status: DC | PRN

## 2018-04-24 MED ORDER — HYDROCOD POLST-CPM POLST ER 10-8 MG/5ML PO SUER: 5.0000 mL | Freq: Every evening | ORAL | 0 refills | Status: DC | PRN

## 2018-04-24 NOTE — ED Triage Notes (Signed)
Per patient with cough x 1 week.

## 2018-04-24 NOTE — ED Provider Notes (Signed)
[facility] ____________________________________________  Time seen: Approximately 8:30 AM  I have reviewed the triage vital signs and the nursing notes.   HISTORY  Chief Complaint No chief complaint on file.   HPI Emily Pena is a 67 y.o. female presenting for evaluation of 1 week of cough and chest congestion symptoms.  States not much nasal congestion.  Some tickle in her throat.  Denies known fevers.  States is continued to function.  States cough is very disruptive in her sleep.  Mostly nonproductive, occasionally productive.  Continues to eat and drink well.  Recently around grandkids that had some sickness.  Over-the-counter cough medication such as Delsym has helped some but no resolution.  Denies other aggravating or alleviating factors.  Reports otherwise doing well.  Denies chest pain, shortness of breath.  No recent antibiotic use.    Past Medical History:  Diagnosis Date  . Arthritis    Joints arthritis.  . Depression   . History of kidney stones   . Hyperlipidemia   . Hypertension     Patient Active Problem List   Diagnosis Date Noted  . Primary osteoarthritis 12/07/2017  . Current moderate episode of major depressive disorder without prior episode (HCC) 06/02/2017  . History of kidney stones 06/02/2017  . Vitiligo 06/02/2017  . Lichen planus 06/02/2017  . Environmental and seasonal allergies 06/02/2017  . Essential hypertension 05/09/2013    Past Surgical History:  Procedure Laterality Date  . CESAREAN SECTION  1985 &1991  . COLONOSCOPY  04/26/2007   [facility]- removed polyp  . NM MYOCAR PERF WALL MOTION  07/06/2007   no significant ischemia present  . US ECHOCARDIOGRAPHY  04/20/2007   normal  . vocal nodules  1978     No current facility-administered medications for this encounter.   Current Outpatient Medications:  .  escitalopram (LEXAPRO) 10 MG tablet, Take 1 tablet (10 mg total) by mouth daily., Disp: 30 tablet, Rfl: 5 .   fluocinonide gel (LIDEX) 0.05 %, Apply 1 application topically 2 (two) times daily., Disp: , Rfl:  .  ketoconazole (NIZORAL) 2 % cream, Apply 1 application topically daily., Disp: , Rfl:  .  lisinopril-hydrochlorothiazide (PRINZIDE,ZESTORETIC) 20-25 MG tablet, Take 1 tablet by mouth daily., Disp: 30 tablet, Rfl: 5 .  montelukast (SINGULAIR) 10 MG tablet, Take 1 tablet (10 mg total) by mouth at bedtime., Disp: 30 tablet, Rfl: 5 .  naproxen sodium (ALEVE) 220 MG tablet, Take 220 mg by mouth., Disp: , Rfl:  .  pimecrolimus (ELIDEL) 1 % cream, Apply topically 2 (two) times daily., Disp: , Rfl:  .  benzonatate (TESSALON PERLES) 100 MG capsule, Take 1 capsule (100 mg total) by mouth 3 (three) times daily as needed., Disp: 15 capsule, Rfl: 0 .  chlorpheniramine-HYDROcodone (TUSSIONEX PENNKINETIC ER) 10-8 MG/5ML SUER, Take 5 mLs by mouth at bedtime as needed. do not drive or operate machinery while taking as can cause drowsiness., Disp: 50 mL, Rfl: 0 .  doxycycline (VIBRAMYCIN) 100 MG capsule, Take 1 capsule (100 mg total) by mouth 2 (two) times daily., Disp: 20 capsule, Rfl: 0  Allergies Meperidine and Propoxyphene  Family History  Problem Relation Age of Onset  . Hypertension Mother   . Heart failure Father   . Aneurysm Maternal Grandfather   . Heart attack Paternal Grandfather   . Cancer Brother     Social History Social History   Tobacco Use  . Smoking status: Never Smoker  . Smokeless tobacco: Never Used  Substance Use  Topics  . Alcohol use: Yes    Alcohol/week: 1.0 - 2.0 standard drinks    Types: 1 - 2 Standard drinks or equivalent per week  . Drug use: No    Review of Systems Constitutional: No fever ENT: Occasional sore throat. Cardiovascular: Denies chest pain. Respiratory: Denies shortness of breath. Gastrointestinal: No abdominal pain.   Musculoskeletal: Negative for back pain. Skin: Negative for rash.   ____________________________________________   PHYSICAL  EXAM:  VITAL SIGNS: ED Triage Vitals  Enc Vitals Group     BP 04/24/18 0813 136/79     Pulse Rate 04/24/18 0813 67     Resp 04/24/18 0813 16     Temp 04/24/18 0813 98 F (36.7 C)     Temp Source 04/24/18 0813 Oral     SpO2 04/24/18 0813 98 %     Weight 04/24/18 0810 170 lb (77.1 kg)     Height 04/24/18 0810 5\' 2"  (1.575 m)     Head Circumference --      Peak Flow --      Pain Score 04/24/18 0810 0     Pain Loc --      Pain Edu? --      Excl. in GC? --     Constitutional: Alert and oriented. Well appearing and in no acute distress. Eyes: Conjunctivae are normal Head: Atraumatic. No sinus tenderness to palpation. No swelling. No erythema.  Ears: no erythema, normal TMs bilaterally.   Nose:No nasal congestion  Mouth/Throat: Mucous membranes are moist. No pharyngeal erythema. No tonsillar swelling or exudate.  Neck: No stridor.  No cervical spine tenderness to palpation. Hematological/Lymphatic/Immunilogical: No cervical lymphadenopathy. Cardiovascular: Normal rate, regular rhythm. Grossly normal heart sounds.  Good peripheral circulation. Respiratory: Normal respiratory effort.  No retractions. No wheezes, rales or rhonchi. Good air movement.  Musculoskeletal: Ambulatory with steady gait.  Neurologic:  Normal speech and language. No gait instability. Skin:  Skin appears warm, dry and intact. No rash noted. Psychiatric: Mood and affect are normal. Speech and behavior are normal. ___________________________________________   LABS (all labs ordered are listed, but only abnormal results are displayed)  Labs Reviewed - No data to display ____________________________________________ _________________________________________   PROCEDURES Procedures     INITIAL IMPRESSION / ASSESSMENT AND PLAN / ED COURSE  Pertinent labs & imaging results that were available during my care of the patient were reviewed by me and considered in my medical decision making (see chart for  details).  Well-appearing patient.  No acute distress.  Suspect viral upper respiratory infection with continued cough.  Will treat with PRN Tessalon Perles and PRN Tussionex.  Hardcopy Rx doxycycline to begin in 2 to 3 days if no improvement.  Sooner evaluation for worsening concerns.  Encourage rest, fluids, supportive care, over-the-counter decongestants.Discussed indication, risks and benefits of medications with patient.  Discussed follow up with Primary care physician this week as needed. Discussed follow up and return parameters including no resolution or any worsening concerns. Patient verbalized understanding and agreed to plan.   ____________________________________________   FINAL CLINICAL IMPRESSION(S) / ED DIAGNOSES  Final diagnoses:  Cough     ED Discharge Orders         Ordered    chlorpheniramine-HYDROcodone (TUSSIONEX PENNKINETIC ER) 10-8 MG/5ML SUER  At bedtime PRN     04/24/18 0829    benzonatate (TESSALON PERLES) 100 MG capsule  3 times daily PRN     04/24/18 0829    doxycycline (VIBRAMYCIN) 100 MG capsule  2 times  daily     04/24/18 1610           Note: This dictation was prepared with Dragon dictation along with smaller phrase technology. Any transcriptional errors that result from this process are unintentional.         Renford Dills, NP 04/24/18 [id number]

## 2018-04-24 NOTE — Discharge Instructions (Addendum)
Take medication as prescribed. Rest. Drink plenty of fluids.  ° °Follow up with your primary care physician this week as needed. Return to Urgent care for new or worsening concerns.  ° °

## 2018-05-30 ENCOUNTER — Other Ambulatory Visit: Payer: Self-pay | Admitting: Internal Medicine

## 2018-05-30 DIAGNOSIS — I10 Essential (primary) hypertension: Secondary | ICD-10-CM

## 2018-06-09 ENCOUNTER — Encounter: Payer: Self-pay | Admitting: Internal Medicine

## 2018-06-09 ENCOUNTER — Ambulatory Visit (INDEPENDENT_AMBULATORY_CARE_PROVIDER_SITE_OTHER): Payer: PRIVATE HEALTH INSURANCE | Admitting: Internal Medicine

## 2018-06-09 ENCOUNTER — Other Ambulatory Visit: Payer: Self-pay

## 2018-06-09 VITALS — BP 124/68 | HR 78 | Ht 62.0 in | Wt 174.0 lb

## 2018-06-09 DIAGNOSIS — Z1211 Encounter for screening for malignant neoplasm of colon: Secondary | ICD-10-CM

## 2018-06-09 DIAGNOSIS — J3089 Other allergic rhinitis: Secondary | ICD-10-CM

## 2018-06-09 DIAGNOSIS — F321 Major depressive disorder, single episode, moderate: Secondary | ICD-10-CM

## 2018-06-09 DIAGNOSIS — I10 Essential (primary) hypertension: Secondary | ICD-10-CM | POA: Diagnosis not present

## 2018-06-09 DIAGNOSIS — Z1231 Encounter for screening mammogram for malignant neoplasm of breast: Secondary | ICD-10-CM

## 2018-06-09 MED ORDER — LISINOPRIL-HYDROCHLOROTHIAZIDE 20-25 MG PO TABS: 1.0000 | ORAL_TABLET | Freq: Every day | ORAL | 5 refills | Status: DC

## 2018-06-09 MED ORDER — ESCITALOPRAM OXALATE 10 MG PO TABS: 10.0000 mg | ORAL_TABLET | Freq: Every day | ORAL | 5 refills | Status: DC

## 2018-06-09 MED ORDER — MONTELUKAST SODIUM 10 MG PO TABS: 10.0000 mg | ORAL_TABLET | Freq: Every day | ORAL | 5 refills | Status: DC

## 2018-06-09 NOTE — Progress Notes (Signed)
Date:  06/09/2018   Name:  Emily MaskerSuzanne James Pena   DOB:  Mar 07, 1951   MRN:  [id number]   Chief Complaint: Hypertension (6 month follow up. ) and Diabetes  Hypertension  This is a chronic problem. The problem is controlled. Pertinent negatives include no chest pain, headaches, palpitations or shortness of breath. Past treatments include ACE inhibitors and diuretics. The current treatment provides significant improvement. There are no compliance problems.   Depression         This is a chronic problem.The problem is unchanged.  Associated symptoms include no fatigue, no appetite change and no headaches.  Past treatments include SSRIs - Selective serotonin reuptake inhibitors.  Compliance with treatment is good. Rash - intermittent bright red on anterior neck - no known exposures to food or topical agents.  Review of Systems  Constitutional: Negative for appetite change, fatigue, fever and unexpected weight change.  HENT: Negative for tinnitus and trouble swallowing.   Eyes: Negative for visual disturbance.  Respiratory: Negative for cough, chest tightness and shortness of breath.   Cardiovascular: Negative for chest pain, palpitations and leg swelling.  Gastrointestinal: Negative for abdominal pain.  Endocrine: Negative for polydipsia and polyuria.  Genitourinary: Negative for dysuria and hematuria.  Musculoskeletal: Negative for arthralgias.  Skin: Positive for rash (intermittent redness on neck).  Allergic/Immunologic: Positive for environmental allergies.  Neurological: Negative for dizziness, tremors, numbness and headaches.  Psychiatric/Behavioral: Positive for depression. Negative for dysphoric mood and sleep disturbance.    Patient Active Problem List   Diagnosis Date Noted  . Primary osteoarthritis 12/07/2017  . Current moderate episode of major depressive disorder without prior episode (HCC) 06/02/2017  . History of kidney stones 06/02/2017  . Vitiligo 06/02/2017  . Lichen  planus 06/02/2017  . Environmental and seasonal allergies 06/02/2017  . Essential hypertension 05/09/2013    Allergies  Allergen Reactions  . Meperidine Nausea And Vomiting  . Propoxyphene Nausea And Vomiting    Past Surgical History:  Procedure Laterality Date  . CESAREAN SECTION  1985 &1991  . COLONOSCOPY  04/26/2007   [facility]- removed polyp  . NM MYOCAR PERF WALL MOTION  07/06/2007   no significant ischemia present  . US ECHOCARDIOGRAPHY  04/20/2007   normal  . vocal nodules  1978    Social History   Tobacco Use  . Smoking status: Never Smoker  . Smokeless tobacco: Never Used  Substance Use Topics  . Alcohol use: Yes    Alcohol/week: 1.0 - 2.0 standard drinks    Types: 1 - 2 Standard drinks or equivalent per week  . Drug use: No     Medication list has been reviewed and updated.  Current Meds  Medication Sig  . clobetasol ointment (TEMOVATE) 0.05 % APPLY OINTMENT TOPICALLY TO AFFECTED AREA TWICE DAILY  . escitalopram (LEXAPRO) 10 MG tablet Take 1 tablet (10 mg total) by mouth daily.  Marland Kitchen. ketoconazole (NIZORAL) 2 % cream Apply 1 application topically daily.  Marland Kitchen. lisinopril-hydrochlorothiazide (PRINZIDE,ZESTORETIC) 20-25 MG tablet TAKE 1 TABLET BY MOUTH ONCE DAILY  . montelukast (SINGULAIR) 10 MG tablet Take 1 tablet (10 mg total) by mouth at bedtime.  . naproxen sodium (ALEVE) 220 MG tablet Take 220 mg by mouth.  . pimecrolimus (ELIDEL) 1 % cream Apply topically 2 (two) times daily.    PHQ 2/9 Scores 06/09/2018 12/07/2017 12/07/2017 07/24/2017  PHQ - 2 Score 0 2 0 0  PHQ- 9 Score - 2 0 2   Wt Readings from Last 3 Encounters:  06/09/18 174 lb (78.9 kg)  04/24/18 170 lb (77.1 kg)  12/07/17 171 lb (77.6 kg)    Physical Exam Vitals signs and nursing note reviewed.  Constitutional:      General: She is not in acute distress.    Appearance: She is well-developed.  HENT:     Head: Normocephalic and atraumatic.  Neck:     Musculoskeletal: Normal range of motion  and neck supple.  Cardiovascular:     Rate and Rhythm: Normal rate and regular rhythm.     Pulses: Normal pulses.  Pulmonary:     Effort: Pulmonary effort is normal. No respiratory distress.  Musculoskeletal: Normal range of motion.  Skin:    General: Skin is warm and dry.     Findings: No rash.  Neurological:     Mental Status: She is alert and oriented to person, place, and time.  Psychiatric:        Behavior: Behavior normal.        Thought Content: Thought content normal.     BP 124/68   Pulse 78   Ht 5\' 2"  (1.575 m)   Wt 174 lb (78.9 kg)   SpO2 95%   BMI 31.83 kg/m   Assessment and Plan: 1. Essential hypertension Controlled, continue healthly diet and exercise - lisinopril-hydrochlorothiazide (PRINZIDE,ZESTORETIC) 20-25 MG tablet; Take 1 tablet by mouth daily.  Dispense: 30 tablet; Refill: 5  2. Current moderate episode of major depressive disorder without prior episode (HCC) Doing well on curreth therapy - escitalopram (LEXAPRO) 10 MG tablet; Take 1 tablet (10 mg total) by mouth daily.  Dispense: 30 tablet; Refill: 5  3. Environmental and seasonal allergies - montelukast (SINGULAIR) 10 MG tablet; Take 1 tablet (10 mg total) by mouth at bedtime.  Dispense: 30 tablet; Refill: 5  4. Colon cancer screening referred - Ambulatory referral to Gastroenterology  5. Encounter for screening mammogram for breast cancer To be ordered by GYN   Partially dictated using Animal nutritionistDragon software. Any errors are unintentional.  Bari EdwardLaura Elenie Coven, MD [facility]  06/09/2018

## 2018-06-17 ENCOUNTER — Other Ambulatory Visit: Payer: Self-pay

## 2018-06-17 DIAGNOSIS — Z1211 Encounter for screening for malignant neoplasm of colon: Secondary | ICD-10-CM

## 2018-06-25 LAB — HM MAMMOGRAPHY

## 2018-06-28 ENCOUNTER — Telehealth: Payer: Self-pay | Admitting: Gastroenterology

## 2018-06-28 NOTE — Telephone Encounter (Signed)
Patient called & L/M on V/M stating she wants to cancel her colonoscopy scheduled for 07-07-2018. Please call to confirm the appointment has been cancelled.

## 2018-06-28 NOTE — Telephone Encounter (Signed)
Patients colonoscopy has been canceled.  Left detailed message on [facility] voicemail to cancel scheduled colonoscopy with Dr. Allegra Lai on 07/07/18.    I also left a message on patients home number to let her know her cancellation message was received and procedure has been canceled.  Thanks [employer]

## 2018-07-07 ENCOUNTER — Ambulatory Visit: Admit: 2018-07-07 | Payer: PRIVATE HEALTH INSURANCE | Admitting: Gastroenterology

## 2018-07-07 SURGERY — COLONOSCOPY WITH PROPOFOL
Anesthesia: Choice

## 2018-09-21 ENCOUNTER — Encounter: Payer: Self-pay | Admitting: Internal Medicine

## 2018-09-21 ENCOUNTER — Ambulatory Visit (INDEPENDENT_AMBULATORY_CARE_PROVIDER_SITE_OTHER): Payer: PRIVATE HEALTH INSURANCE | Admitting: Internal Medicine

## 2018-09-21 ENCOUNTER — Other Ambulatory Visit: Payer: Self-pay | Admitting: Internal Medicine

## 2018-09-21 ENCOUNTER — Other Ambulatory Visit: Payer: Self-pay

## 2018-09-21 VITALS — BP 112/78 | HR 74 | Ht 62.0 in | Wt 174.0 lb

## 2018-09-21 DIAGNOSIS — N3000 Acute cystitis without hematuria: Secondary | ICD-10-CM | POA: Diagnosis not present

## 2018-09-21 LAB — POC URINALYSIS WITH MICROSCOPIC (NON AUTO)MANUAL RESULT
Bilirubin, UA: NEGATIVE
Blood, UA: NEGATIVE
Crystals: 0
Epithelial cells, urine per micros: 0
Glucose, UA: NEGATIVE
Ketones, UA: NEGATIVE
Mucus, UA: 0
Nitrite, UA: POSITIVE
Protein, UA: NEGATIVE
RBC: 0 M/uL — AB (ref 4.04–5.48)
Spec Grav, UA: 1.02 (ref 1.010–1.025)
Urobilinogen, UA: 0.2 E.U./dL
pH, UA: 6 (ref 5.0–8.0)

## 2018-09-21 MED ORDER — CIPROFLOXACIN HCL 250 MG PO TABS: 250.0000 mg | ORAL_TABLET | Freq: Two times a day (BID) | ORAL | 0 refills | Status: AC

## 2018-09-21 NOTE — Progress Notes (Signed)
Date:  09/21/2018   Name:  Emily Pena   DOB:  [birth year]   MRN:  [id number]   Chief Complaint: Urinary Tract Infection (Started Saturday- sxs of burnign when urinating, and feels like elephant on stomach. Urgency. Macrobid worked in past. Used this and finished a week ago. Has been treated by OBGYN in past twice since march. Methenamine hippurate? )  Urinary Tract Infection   This is a recurrent problem. The current episode started in the past 7 days. The problem has been gradually worsening. The quality of the pain is described as burning. The pain is mild. There has been no fever. Associated symptoms include frequency and urgency. Pertinent negatives include no chills or hematuria. She has tried antibiotics (finished macrobid a week ago) for the symptoms. The treatment provided significant (but sx started again) relief.  She was treated by GYN in march with Bactrim then macrobid after culture returned,  She did well for a few weeks then 2 weeks ago sx recurred and she took macrobid again.  Now, one week later, sx are worse than before.  Review of Systems  Constitutional: Negative for chills, fatigue and fever.  Respiratory: Negative for cough, shortness of breath and wheezing.   Cardiovascular: Negative for chest pain.  Gastrointestinal: Negative for abdominal pain, constipation and diarrhea.  Genitourinary: Positive for frequency and urgency. Negative for difficulty urinating, dyspareunia and hematuria.  Neurological: Negative for dizziness and headaches.    Patient Active Problem List   Diagnosis Date Noted  . Primary osteoarthritis 12/07/2017  . Current moderate episode of major depressive disorder without prior episode (HCC) 06/02/2017  . History of kidney stones 06/02/2017  . Vitiligo 06/02/2017  . Lichen planus 06/02/2017  . Environmental and seasonal allergies 06/02/2017  . Essential hypertension 05/09/2013    Allergies  Allergen Reactions  . Meperidine Nausea And  Vomiting  . Propoxyphene Nausea And Vomiting    Past Surgical History:  Procedure Laterality Date  . CESAREAN SECTION  1985 &1991  . COLONOSCOPY  04/26/2007   [facility]- removed polyp  . NM MYOCAR PERF WALL MOTION  07/06/2007   no significant ischemia present  . US ECHOCARDIOGRAPHY  04/20/2007   normal  . vocal nodules  1978    Social History   Tobacco Use  . Smoking status: Never Smoker  . Smokeless tobacco: Never Used  Substance Use Topics  . Alcohol use: Yes    Alcohol/week: 1.0 - 2.0 standard drinks    Types: 1 - 2 Standard drinks or equivalent per week  . Drug use: No     Medication list has been reviewed and updated.  No outpatient medications have been marked as taking for the 09/21/18 encounter (Office Visit) with Reubin MilanBerglund, Lorali Khamis H, MD.    [facility] 06/09/2018 12/07/2017 12/07/2017 07/24/2017  PHQ - 2 Score 0 2 0 0  PHQ- 9 Score - 2 0 2    BP Readings from Last 3 Encounters:  09/21/18 112/78  06/09/18 124/68  04/24/18 136/79    Physical Exam Vitals signs and nursing note reviewed.  Constitutional:      General: She is not in acute distress.    Appearance: She is well-developed.  HENT:     Head: Normocephalic and atraumatic.  Cardiovascular:     Rate and Rhythm: Normal rate and regular rhythm.     Pulses: Normal pulses.  Pulmonary:     Effort: Pulmonary effort is normal. No respiratory distress.     Breath  sounds: Normal breath sounds. No wheezing or rhonchi.  Abdominal:     General: Bowel sounds are normal.     Palpations: Abdomen is soft.     Tenderness: There is abdominal tenderness in the suprapubic area. There is no right CVA tenderness or left CVA tenderness.  Musculoskeletal: Normal range of motion.  Skin:    General: Skin is warm and dry.     Findings: No rash.  Neurological:     Mental Status: She is alert and oriented to person, place, and time.  Psychiatric:        Behavior: Behavior normal.        Thought Content: Thought  content normal.     Wt Readings from Last 3 Encounters:  09/21/18 174 lb (78.9 kg)  06/09/18 174 lb (78.9 kg)  04/24/18 170 lb (77.1 kg)    BP 112/78   Pulse 74   Ht 5\' 2"  (1.575 m)   Wt 174 lb (78.9 kg)   SpO2 95%   BMI 31.83 kg/m   Assessment and Plan: 1. Acute cystitis without hematuria Increase fluids Will send to culture, may need to see Urology - ciprofloxacin (CIPRO) 250 MG tablet; Take 1 tablet (250 mg total) by mouth 2 (two) times daily for 7 days.  Dispense: 14 tablet; Refill: 0 - Urine Culture - POC urinalysis w microscopic (non auto)   Partially dictated using Animal nutritionist. Any errors are unintentional.  Bari Edward, MD [facility]  09/21/2018

## 2018-09-23 LAB — URINE CULTURE

## 2018-12-06 ENCOUNTER — Other Ambulatory Visit: Payer: Self-pay | Admitting: Internal Medicine

## 2018-12-06 DIAGNOSIS — I10 Essential (primary) hypertension: Secondary | ICD-10-CM

## 2019-02-21 ENCOUNTER — Other Ambulatory Visit: Payer: Self-pay | Admitting: Internal Medicine

## 2019-02-21 DIAGNOSIS — F321 Major depressive disorder, single episode, moderate: Secondary | ICD-10-CM

## 2019-02-22 ENCOUNTER — Encounter: Payer: Self-pay | Admitting: Internal Medicine

## 2019-02-22 ENCOUNTER — Ambulatory Visit (INDEPENDENT_AMBULATORY_CARE_PROVIDER_SITE_OTHER): Payer: PRIVATE HEALTH INSURANCE | Admitting: Internal Medicine

## 2019-02-22 ENCOUNTER — Other Ambulatory Visit: Payer: Self-pay

## 2019-02-22 VITALS — BP 128/64 | HR 67 | Ht 62.0 in | Wt 176.0 lb

## 2019-02-22 DIAGNOSIS — I10 Essential (primary) hypertension: Secondary | ICD-10-CM | POA: Diagnosis not present

## 2019-02-22 DIAGNOSIS — Z0000 Encounter for general adult medical examination without abnormal findings: Secondary | ICD-10-CM | POA: Diagnosis not present

## 2019-02-22 DIAGNOSIS — F321 Major depressive disorder, single episode, moderate: Secondary | ICD-10-CM

## 2019-02-22 DIAGNOSIS — Z1231 Encounter for screening mammogram for malignant neoplasm of breast: Secondary | ICD-10-CM

## 2019-02-22 DIAGNOSIS — Z1211 Encounter for screening for malignant neoplasm of colon: Secondary | ICD-10-CM | POA: Diagnosis not present

## 2019-02-22 DIAGNOSIS — J3089 Other allergic rhinitis: Secondary | ICD-10-CM

## 2019-02-22 DIAGNOSIS — Z23 Encounter for immunization: Secondary | ICD-10-CM | POA: Diagnosis not present

## 2019-02-22 LAB — POCT URINALYSIS DIPSTICK
Bilirubin, UA: NEGATIVE
Glucose, UA: NEGATIVE
Ketones, UA: NEGATIVE
Leukocytes, UA: NEGATIVE
Nitrite, UA: NEGATIVE
Protein, UA: NEGATIVE
Spec Grav, UA: 1.02 (ref 1.010–1.025)
Urobilinogen, UA: 0.2 E.U./dL
pH, UA: 5 (ref 5.0–8.0)

## 2019-02-22 MED ORDER — LISINOPRIL-HYDROCHLOROTHIAZIDE 20-25 MG PO TABS: 1.0000 | ORAL_TABLET | Freq: Every day | ORAL | 3 refills | Status: DC

## 2019-02-22 MED ORDER — ESCITALOPRAM OXALATE 10 MG PO TABS: 10.0000 mg | ORAL_TABLET | Freq: Every day | ORAL | 3 refills | Status: DC

## 2019-02-22 NOTE — Patient Instructions (Addendum)
[facility] - call for colonoscopy Phone: [phone]   Please ask your GYN to send me a copy of this year's mammogram report

## 2019-02-22 NOTE — Progress Notes (Signed)
Date:  02/22/2019   Name:  Emily Pena   DOB:  02/08/1951   MRN:  [id number]   Chief Complaint: Annual Exam (Gets Mammo and breast and pap from Dr Lequita HaltMorgan- [facility]) Emily Pena is a 68 y.o. female who presents today for her Complete Annual Exam. She feels well. She reports exercising regularly. She reports she is sleeping well. She denies breast issues - followed by GYN.  [place] Lequita HaltMorgan Colonoscopy 2008 - was scheduled but cancelled due to Covid-19 DEXA  02/2016 Immunizations PPV-23 11/2017  Prevnar-13 due  Hypertension This is a chronic problem. The problem is controlled (BP at home 120/80). Pertinent negatives include no chest pain, headaches, palpitations or shortness of breath. There are no associated agents to hypertension. Past treatments include diuretics and ACE inhibitors. The current treatment provides significant improvement. There are no compliance problems.  There is no history of kidney disease or CAD/MI.  Depression        This is a chronic problem.The problem is unchanged.  Associated symptoms include no fatigue and no headaches.  Past treatments include SSRIs - Selective serotonin reuptake inhibitors.  Compliance with treatment is good.  Previous treatment provided significant relief.  Lab Results  Component Value Date   CREATININE 0.78 12/07/2017   BUN 16 12/07/2017   NA 140 12/07/2017   K 3.9 12/07/2017   CL 98 12/07/2017   CO2 23 12/07/2017   Lab Results  Component Value Date   TSH 2.290 12/07/2017     Review of Systems  Constitutional: Negative for chills, fatigue and fever.  HENT: Negative for congestion, hearing loss, tinnitus, trouble swallowing and voice change.   Eyes: Negative for visual disturbance.  Respiratory: Negative for cough, chest tightness, shortness of breath and wheezing.   Cardiovascular: Negative for chest pain, palpitations and leg swelling.  Gastrointestinal: Negative for abdominal  pain, constipation, diarrhea and vomiting.  Endocrine: Negative for polydipsia and polyuria.  Genitourinary: Negative for dysuria, frequency, genital sores, vaginal bleeding and vaginal discharge.       Hot flashes and sweats  Musculoskeletal: Negative for arthralgias, gait problem and joint swelling.  Skin: Negative for color change and rash.  Neurological: Negative for dizziness, tremors, light-headedness and headaches.  Hematological: Negative for adenopathy. Does not bruise/bleed easily.  Psychiatric/Behavioral: Positive for depression. Negative for dysphoric mood and sleep disturbance. The patient is not nervous/anxious.     Patient Active Problem List   Diagnosis Date Noted  . Primary osteoarthritis 12/07/2017  . Current moderate episode of major depressive disorder without prior episode (HCC) 06/02/2017  . History of kidney stones 06/02/2017  . Vitiligo 06/02/2017  . Lichen planus 06/02/2017  . Environmental and seasonal allergies 06/02/2017  . Essential hypertension 05/09/2013    Allergies  Allergen Reactions  . Meperidine Nausea And Vomiting  . Propoxyphene Nausea And Vomiting    Past Surgical History:  Procedure Laterality Date  . CESAREAN SECTION  1985 &1991  . COLONOSCOPY  04/26/2007   [facility]- removed polyp  . NM MYOCAR PERF WALL MOTION  07/06/2007   no significant ischemia present  . US ECHOCARDIOGRAPHY  04/20/2007   normal  . vocal nodules  1978    Social History   Tobacco Use  . Smoking status: Never Smoker  . Smokeless tobacco: Never Used  Substance Use Topics  . Alcohol use: Yes    Alcohol/week: 1.0 - 2.0 standard drinks    Types: 1 - 2 Standard drinks  or equivalent per week  . Drug use: No     Medication list has been reviewed and updated.  Current Meds  Medication Sig  . clobetasol ointment (TEMOVATE) 0.05 % APPLY OINTMENT TOPICALLY TO AFFECTED AREA TWICE DAILY  . conjugated estrogens (PREMARIN) vaginal cream Premarin 0.625 mg/gram  vaginal cream  Insert 0.5 g every day by vaginal route at bedtime for 14 days.  Following nightly treatment x 2 weeks, decrease dosing to 0.5g per vagina 2-3x/week.  . escitalopram (LEXAPRO) 10 MG tablet Take 1 tablet (10 mg total) by mouth daily.  Marland Kitchen ketoconazole (NIZORAL) 2 % cream Apply 1 application topically daily.  Marland Kitchen lisinopril-hydrochlorothiazide (ZESTORETIC) 20-25 MG tablet Take 1 tablet by mouth daily.  . montelukast (SINGULAIR) 10 MG tablet Take 1 tablet (10 mg total) by mouth at bedtime.  . naproxen sodium (ALEVE) 220 MG tablet Take 220 mg by mouth.  . pimecrolimus (ELIDEL) 1 % cream Apply topically 2 (two) times daily.  . [DISCONTINUED] escitalopram (LEXAPRO) 10 MG tablet Take 1 tablet (10 mg total) by mouth daily.  . [DISCONTINUED] lisinopril-hydrochlorothiazide (ZESTORETIC) 20-25 MG tablet Take 1 tablet by mouth once daily    PHQ 2/9 Scores 02/22/2019 06/09/2018 12/07/2017 12/07/2017  PHQ - 2 Score 0 0 2 0  PHQ- 9 Score 3 - 2 0    BP Readings from Last 3 Encounters:  02/22/19 128/64  09/21/18 112/78  06/09/18 124/68    Physical Exam Vitals signs and nursing note reviewed.  Constitutional:      General: She is not in acute distress.    Appearance: She is well-developed.  HENT:     Head: Normocephalic and atraumatic.     Right Ear: Tympanic membrane and ear canal normal.     Left Ear: Tympanic membrane and ear canal normal.     Nose:     Right Sinus: No maxillary sinus tenderness.     Left Sinus: No maxillary sinus tenderness.  Eyes:     General: No scleral icterus.       Right eye: No discharge.        Left eye: No discharge.     Conjunctiva/sclera: Conjunctivae normal.  Neck:     Musculoskeletal: Normal range of motion. No erythema.     Thyroid: No thyromegaly.     Vascular: No carotid bruit.  Cardiovascular:     Rate and Rhythm: Normal rate and regular rhythm.     Pulses: Normal pulses.     Heart sounds: Normal heart sounds.  Pulmonary:     Effort:  Pulmonary effort is normal. No respiratory distress.     Breath sounds: No wheezing.  Abdominal:     General: Bowel sounds are normal.     Palpations: Abdomen is soft.     Tenderness: There is no abdominal tenderness.  Musculoskeletal: Normal range of motion.  Lymphadenopathy:     Cervical: No cervical adenopathy.  Skin:    General: Skin is warm and dry.     Findings: No rash.  Neurological:     Mental Status: She is alert and oriented to person, place, and time.     Cranial Nerves: No cranial nerve deficit.     Sensory: No sensory deficit.     Deep Tendon Reflexes: Reflexes are normal and symmetric.  Psychiatric:        Attention and Perception: Attention normal.        Mood and Affect: Mood normal.        Speech: Speech normal.  Behavior: Behavior normal.        Thought Content: Thought content normal. Thought content does not include suicidal ideation. Thought content does not include suicidal plan.        Cognition and Memory: Cognition normal.        Judgment: Judgment normal.     Wt Readings from Last 3 Encounters:  02/22/19 176 lb (79.8 kg)  09/21/18 174 lb (78.9 kg)  06/09/18 174 lb (78.9 kg)    BP 128/64   Pulse 67   Ht 5\' 2"  (1.575 m)   Wt 176 lb (79.8 kg)   SpO2 97%   BMI 32.19 kg/m   Assessment and Plan: 1. Annual physical exam Normal exam except for weight Continue exercise and dietary changes Menopausal sweats are bothersome - discuss with GYN; consider bio identical compounded HRT - Lipid panel - POCT urinalysis dipstick  2. Encounter for screening mammogram for breast cancer To be done by GYN in December  3. Colon cancer screening Cancelled due to covid - pt is given the number of GI to call to reschedule  4. Essential hypertension Clinically stable exam with well controlled BP.   Tolerating medications, lisinopril hct 20/25 mg, without side effects at this time. Pt to continue current regimen and low sodium diet; benefits of regular  exercise as able discussed. - CBC with Differential/Platelet - Comprehensive metabolic panel - TSH + free T4 - lisinopril-hydrochlorothiazide (ZESTORETIC) 20-25 MG tablet; Take 1 tablet by mouth daily.  Dispense: 90 tablet; Refill: 3  5. Current moderate episode of major depressive disorder without prior episode ([place]) Clinically stable and doing well on current medication.  No side effect or SI/HI noted - escitalopram (LEXAPRO) 10 MG tablet; Take 1 tablet (10 mg total) by mouth daily.  Dispense: 90 tablet; Refill: 3  6. Environmental and seasonal allergies Continue to use singulair seasonally   7. Need for vaccination for pneumococcus - Pneumococcal conjugate vaccine 13-valent IM   Partially dictated using Editor, commissioning. Any errors are unintentional.  Halina Maidens, MD [facility]  02/22/2019

## 2019-02-23 LAB — CBC WITH DIFFERENTIAL/PLATELET
Basophils Absolute: 0.1 10*3/uL (ref 0.0–0.2)
Basos: 1 %
EOS (ABSOLUTE): 0.3 10*3/uL (ref 0.0–0.4)
Eos: 3 %
Hematocrit: 43.8 % (ref 34.0–46.6)
Hemoglobin: 15 g/dL (ref 11.1–15.9)
Immature Grans (Abs): 0.1 10*3/uL (ref 0.0–0.1)
Immature Granulocytes: 1 %
Lymphocytes Absolute: 2.8 10*3/uL (ref 0.7–3.1)
Lymphs: 30 %
MCH: 30.6 pg (ref 26.6–33.0)
MCHC: 34.2 g/dL (ref 31.5–35.7)
MCV: 89 fL (ref 79–97)
Monocytes Absolute: 0.8 10*3/uL (ref 0.1–0.9)
Monocytes: 9 %
Neutrophils Absolute: 5.4 10*3/uL (ref 1.4–7.0)
Neutrophils: 56 %
Platelets: 328 10*3/uL (ref 150–450)
RBC: 4.9 x10E6/uL (ref 3.77–5.28)
RDW: 12.5 % (ref 11.7–15.4)
WBC: 9.4 10*3/uL (ref 3.4–10.8)

## 2019-02-23 LAB — COMPREHENSIVE METABOLIC PANEL
ALT: 54 IU/L — ABNORMAL HIGH (ref 0–32)
AST: 27 IU/L (ref 0–40)
Albumin/Globulin Ratio: 1.6 (ref 1.2–2.2)
Albumin: 4.2 g/dL (ref 3.8–4.8)
Alkaline Phosphatase: 93 IU/L (ref 39–117)
BUN/Creatinine Ratio: 21 (ref 12–28)
BUN: 14 mg/dL (ref 8–27)
Bilirubin Total: 0.3 mg/dL (ref 0.0–1.2)
CO2: 25 mmol/L (ref 20–29)
Calcium: 9.3 mg/dL (ref 8.7–10.3)
Chloride: 105 mmol/L (ref 96–106)
Creatinine, Ser: 0.66 mg/dL (ref 0.57–1.00)
GFR calc Af Amer: 106 mL/min/{1.73_m2} (ref >59)
GFR calc non Af Amer: 92 mL/min/{1.73_m2} (ref >59)
Globulin, Total: 2.6 g/dL (ref 1.5–4.5)
Glucose: 90 mg/dL (ref 65–99)
Potassium: 4.3 mmol/L (ref 3.5–5.2)
Sodium: 144 mmol/L (ref 134–144)
Total Protein: 6.8 g/dL (ref 6.0–8.5)

## 2019-02-23 LAB — TSH+FREE T4
Free T4: 1.28 ng/dL (ref 0.82–1.77)
TSH: 1.77 u[IU]/mL (ref 0.450–4.500)

## 2019-02-23 LAB — LIPID PANEL
Chol/HDL Ratio: 7 ratio — ABNORMAL HIGH (ref 0.0–4.4)
Cholesterol, Total: 296 mg/dL — ABNORMAL HIGH (ref 100–199)
HDL: 42 mg/dL (ref >39)
LDL Chol Calc (NIH): 200 mg/dL — ABNORMAL HIGH (ref 0–99)
Triglycerides: 272 mg/dL — ABNORMAL HIGH (ref 0–149)
VLDL Cholesterol Cal: 54 mg/dL — ABNORMAL HIGH (ref 5–40)

## 2019-02-24 ENCOUNTER — Encounter: Payer: Self-pay | Admitting: Internal Medicine

## 2019-02-24 DIAGNOSIS — E782 Mixed hyperlipidemia: Secondary | ICD-10-CM | POA: Insufficient documentation

## 2019-02-28 NOTE — Progress Notes (Signed)
Spoke with pt and informed this information. She said at this point she does not want to do anything for her cholesterol. She said she can go back to old medical files to find what she has taken before but it will not be this week because she is "slammed" at work. She does not wish to start anything new at this time.

## 2019-07-05 ENCOUNTER — Encounter: Payer: Self-pay | Admitting: Internal Medicine

## 2019-07-05 ENCOUNTER — Other Ambulatory Visit: Payer: Self-pay

## 2019-07-05 ENCOUNTER — Ambulatory Visit (INDEPENDENT_AMBULATORY_CARE_PROVIDER_SITE_OTHER): Payer: PRIVATE HEALTH INSURANCE | Admitting: Internal Medicine

## 2019-07-05 VITALS — BP 124/78 | HR 73 | Temp 97.5°F | Ht 62.0 in | Wt 178.0 lb

## 2019-07-05 DIAGNOSIS — I10 Essential (primary) hypertension: Secondary | ICD-10-CM | POA: Diagnosis not present

## 2019-07-05 DIAGNOSIS — K644 Residual hemorrhoidal skin tags: Secondary | ICD-10-CM | POA: Diagnosis not present

## 2019-07-05 NOTE — Progress Notes (Signed)
Date:  07/05/2019   Name:  Emily Pena   DOB:  October 01, 1950   MRN:  [id number]   Chief Complaint: Rectal Bleeding (1 week ago patient had bleeding after urinating. She had a little bit of blood in the toilet and on the toilet paper when wiping her rectum. Would see blood for two days and then it stopped. No pain. Has not had hemorrhoids since she was pregnant at the age 69. )  Rectal Bleeding  The current episode started more than 1 week ago. The onset was sudden. The problem has been resolved. The patient is experiencing no pain. The stool is described as soft. There was no prior successful therapy. Associated symptoms include hemorrhoids. Pertinent negatives include no fever, no diarrhea, no rectal pain, no vaginal bleeding, no chest pain, no headaches and no coughing.   She has history of colon polyp - last colonoscopy was in 2008.  Needs to have a repeat exam.  Lab Results  Component Value Date   CREATININE 0.66 02/22/2019   BUN 14 02/22/2019   NA 144 02/22/2019   K 4.3 02/22/2019   CL 105 02/22/2019   CO2 25 02/22/2019   Lab Results  Component Value Date   CHOL 296 (H) 02/22/2019   HDL 42 02/22/2019   LDLCALC 200 (H) 02/22/2019   TRIG 272 (H) 02/22/2019   CHOLHDL 7.0 (H) 02/22/2019   Lab Results  Component Value Date   TSH 1.770 02/22/2019   No results found for: HGBA1C   Review of Systems  Constitutional: Negative for chills and fever.  Respiratory: Negative for cough and chest tightness.   Cardiovascular: Negative for chest pain.  Gastrointestinal: Positive for anal bleeding, hematochezia and hemorrhoids. Negative for blood in stool, constipation, diarrhea and rectal pain.  Genitourinary: Negative for vaginal bleeding.  Neurological: Negative for dizziness, light-headedness and headaches.    Patient Active Problem List   Diagnosis Date Noted  . Hyperlipidemia, mixed 02/24/2019  . Primary osteoarthritis 12/07/2017  . Current moderate episode of major  depressive disorder without prior episode ([place]) 06/02/2017  . History of kidney stones 06/02/2017  . Vitiligo 06/02/2017  . Lichen planus 62/83/1517  . Environmental and seasonal allergies 06/02/2017  . Essential hypertension 05/09/2013    Allergies  Allergen Reactions  . Meperidine Nausea And Vomiting  . Propoxyphene Nausea And Vomiting    Past Surgical History:  Procedure Laterality Date  . [place] &1991  . COLONOSCOPY  04/26/2007   [facility]- removed polyp  . NM MYOCAR PERF WALL MOTION  07/06/2007   no significant ischemia present  . US ECHOCARDIOGRAPHY  04/20/2007   normal  . vocal nodules  1978    Social History   Tobacco Use  . Smoking status: Never Smoker  . Smokeless tobacco: Never Used  Substance Use Topics  . Alcohol use: Yes    Alcohol/week: 1.0 - 2.0 standard drinks    Types: 1 - 2 Standard drinks or equivalent per week  . Drug use: No     Medication list has been reviewed and updated.  Current Meds  Medication Sig  . clobetasol ointment (TEMOVATE) 0.05 % APPLY OINTMENT TOPICALLY TO AFFECTED AREA TWICE DAILY  . escitalopram (LEXAPRO) 10 MG tablet Take 1 tablet (10 mg total) by mouth daily.  Marland Kitchen ketoconazole (NIZORAL) 2 % cream Apply 1 application topically daily.  Marland Kitchen lisinopril-hydrochlorothiazide (ZESTORETIC) 20-25 MG tablet Take 1 tablet by mouth daily.  . montelukast (SINGULAIR) 10 MG tablet Take  1 tablet (10 mg total) by mouth at bedtime.  . naproxen sodium (ALEVE) 220 MG tablet Take 220 mg by mouth.  . pimecrolimus (ELIDEL) 1 % cream Apply topically 2 (two) times daily.    PHQ 2/9 Scores 07/05/2019 02/22/2019 06/09/2018 12/07/2017  PHQ - 2 Score 0 0 0 2  PHQ- 9 Score - 3 - 2    BP Readings from Last 3 Encounters:  07/05/19 124/78  02/22/19 128/64  09/21/18 112/78    Physical Exam Vitals and nursing note reviewed.  Constitutional:      General: She is not in acute distress.    Appearance: She is well-developed.  HENT:      Head: Normocephalic and atraumatic.  Cardiovascular:     Rate and Rhythm: Normal rate and regular rhythm.     Pulses: Normal pulses.     Heart sounds: No murmur.  Pulmonary:     Effort: Pulmonary effort is normal. No respiratory distress.     Breath sounds: No wheezing or rhonchi.  Abdominal:     General: Abdomen is flat. Bowel sounds are normal.     Palpations: Abdomen is soft.  Genitourinary:    Rectum: Guaiac result negative. External hemorrhoid present. No tenderness or anal fissure.  Musculoskeletal:     Cervical back: Normal range of motion and neck supple.     Right lower leg: No edema.     Left lower leg: No edema.  Skin:    General: Skin is warm and dry.     Findings: No rash.  Neurological:     Mental Status: She is alert and oriented to person, place, and time.  Psychiatric:        Behavior: Behavior normal.        Thought Content: Thought content normal.     Wt Readings from Last 3 Encounters:  07/05/19 178 lb (80.7 kg)  02/22/19 176 lb (79.8 kg)  09/21/18 174 lb (78.9 kg)    BP 124/78   Pulse 73   Temp (!) 97.5 F (36.4 C) (Temporal)   Ht 5\' 2"  (1.575 m)   Wt 178 lb (80.7 kg)   SpO2 96%   BMI 32.56 kg/m   Assessment and Plan: 1. External hemorrhoid, bleeding Now resolved; likely due to local irritation of external hemorrhoid Recommend cortisone cream topically bid to rectum as needed  2. Essential hypertensio Clinically stable exam with well controlled BP. Tolerating medications without side effects at this time. Pt to continue current regimen and low sodium diet; benefits of regular exercise as able discussed.   Partially dictated using . Any errors are unintentional.  Animal nutritionist, MD [facility]  07/05/2019

## 2019-08-24 ENCOUNTER — Ambulatory Visit: Payer: PRIVATE HEALTH INSURANCE | Admitting: Internal Medicine

## 2019-10-10 ENCOUNTER — Other Ambulatory Visit: Payer: Self-pay | Admitting: Internal Medicine

## 2019-10-10 DIAGNOSIS — J3089 Other allergic rhinitis: Secondary | ICD-10-CM

## 2019-10-10 NOTE — Telephone Encounter (Signed)
Requested Prescriptions  Pending Prescriptions Disp Refills   montelukast (SINGULAIR) 10 MG tablet [Pharmacy Med Name: Montelukast Sodium 10 MG Oral Tablet] 90 tablet 1    Sig: TAKE 1 TABLET BY MOUTH AT BEDTIME     Pulmonology:  Leukotriene Inhibitors Passed - 10/10/2019 10:10 AM      Passed - Valid encounter within last 12 months    Recent Outpatient Visits          3 months ago External hemorrhoid, bleeding   [facility] Reubin Milan, MD   7 months ago Annual physical exam   [facility] Reubin Milan, MD   1 year ago Acute cystitis without hematuria   [facility] Reubin Milan, MD   1 year ago Essential hypertension   [facility] Reubin Milan, MD   1 year ago Essential hypertension   [facility] Reubin Milan, MD      Future Appointments            In 4 months Judithann Graves Nyoka Cowden, MD [facility], [facility]

## 2020-02-27 ENCOUNTER — Ambulatory Visit (INDEPENDENT_AMBULATORY_CARE_PROVIDER_SITE_OTHER): Payer: PRIVATE HEALTH INSURANCE | Admitting: Internal Medicine

## 2020-02-27 ENCOUNTER — Other Ambulatory Visit: Payer: Self-pay

## 2020-02-27 ENCOUNTER — Encounter: Payer: Self-pay | Admitting: Internal Medicine

## 2020-02-27 VITALS — BP 122/64 | HR 67 | Temp 97.7°F | Ht 62.0 in | Wt 179.0 lb

## 2020-02-27 DIAGNOSIS — Z1231 Encounter for screening mammogram for malignant neoplasm of breast: Secondary | ICD-10-CM

## 2020-02-27 DIAGNOSIS — F321 Major depressive disorder, single episode, moderate: Secondary | ICD-10-CM

## 2020-02-27 DIAGNOSIS — I10 Essential (primary) hypertension: Secondary | ICD-10-CM | POA: Diagnosis not present

## 2020-02-27 DIAGNOSIS — K625 Hemorrhage of anus and rectum: Secondary | ICD-10-CM | POA: Diagnosis not present

## 2020-02-27 DIAGNOSIS — Z1211 Encounter for screening for malignant neoplasm of colon: Secondary | ICD-10-CM | POA: Diagnosis not present

## 2020-02-27 DIAGNOSIS — J3089 Other allergic rhinitis: Secondary | ICD-10-CM

## 2020-02-27 DIAGNOSIS — E782 Mixed hyperlipidemia: Secondary | ICD-10-CM | POA: Diagnosis not present

## 2020-02-27 DIAGNOSIS — Z0000 Encounter for general adult medical examination without abnormal findings: Secondary | ICD-10-CM | POA: Diagnosis not present

## 2020-02-27 LAB — POCT URINALYSIS DIPSTICK
Bilirubin, UA: NEGATIVE
Blood, UA: NEGATIVE
Glucose, UA: NEGATIVE
Ketones, UA: NEGATIVE
Leukocytes, UA: NEGATIVE
Nitrite, UA: NEGATIVE
Protein, UA: NEGATIVE
Spec Grav, UA: 1.015 (ref 1.010–1.025)
Urobilinogen, UA: 0.2 E.U./dL
pH, UA: 5 (ref 5.0–8.0)

## 2020-02-27 MED ORDER — ESCITALOPRAM OXALATE 10 MG PO TABS: 10.0000 mg | ORAL_TABLET | Freq: Every day | ORAL | 3 refills | Status: DC

## 2020-02-27 MED ORDER — MONTELUKAST SODIUM 10 MG PO TABS: 10.0000 mg | ORAL_TABLET | Freq: Every day | ORAL | 1 refills | Status: DC

## 2020-02-27 MED ORDER — LISINOPRIL-HYDROCHLOROTHIAZIDE 20-25 MG PO TABS: 1.0000 | ORAL_TABLET | Freq: Every day | ORAL | 3 refills | Status: DC

## 2020-02-27 NOTE — Progress Notes (Signed)
Date:  02/27/2020   Name:  Emily Pena   DOB:  [birth year]   MRN:  [id number]   Chief Complaint: Annual Exam (Breast Exam. No pap- discontinued. Overdue for Colonscopy. Delcined flu shot.)  Emily Pena is a 69 y.o. female who presents today for her Complete Annual Exam. She feels well. She reports exercising - walking 3x weekly. She reports she is sleeping well. Breast complaints - none.  Mammogram: 05/2017 DEXA: 02/2016 normal Pap smear: discontinued Colonoscopy: 03/2007 - ordered last year but cancelled by patient  Immunization History  Administered Date(s) Administered  . Pneumococcal Conjugate-13 02/22/2019  . Pneumococcal Polysaccharide-23 12/07/2017    Hypertension This is a chronic problem. The problem is controlled. Pertinent negatives include no chest pain, headaches, palpitations or shortness of breath. Past treatments include ACE inhibitors and diuretics. The current treatment provides significant improvement.  Depression        This is a chronic problem.The problem is unchanged.  Associated symptoms include no fatigue and no headaches.  Past treatments include SSRIs - Selective serotonin reuptake inhibitors.  Compliance with treatment is good. Hyperlipidemia This is a chronic problem. The problem is uncontrolled. Pertinent negatives include no chest pain or shortness of breath. She is currently on no antihyperlipidemic treatment (intolerant of several statins).  Rectal Bleeding  The current episode started 5 to 7 days ago. The problem occurs occasionally. The problem has been unchanged. The stool is described as hard. Pertinent negatives include no fever, no abdominal pain, no diarrhea, no vomiting, no vaginal bleeding, no vaginal discharge, no chest pain, no headaches, no coughing and no rash.    Lab Results  Component Value Date   CREATININE 0.66 02/22/2019   BUN 14 02/22/2019   NA 144 02/22/2019   K 4.3 02/22/2019   CL 105 02/22/2019   CO2 25  02/22/2019   Lab Results  Component Value Date   CHOL 296 (H) 02/22/2019   HDL 42 02/22/2019   LDLCALC 200 (H) 02/22/2019   TRIG 272 (H) 02/22/2019   CHOLHDL 7.0 (H) 02/22/2019   Lab Results  Component Value Date   TSH 1.770 02/22/2019   No results found for: HGBA1C Lab Results  Component Value Date   WBC 9.4 02/22/2019   HGB 15.0 02/22/2019   HCT 43.8 02/22/2019   MCV 89 02/22/2019   PLT 328 02/22/2019   Lab Results  Component Value Date   ALT 54 (H) 02/22/2019   AST 27 02/22/2019   ALKPHOS 93 02/22/2019   BILITOT 0.3 02/22/2019     Review of Systems  Constitutional: Negative for chills, fatigue and fever.  HENT: Negative for congestion, hearing loss, tinnitus, trouble swallowing and voice change.   Eyes: Negative for visual disturbance.  Respiratory: Negative for cough, chest tightness, shortness of breath and wheezing.   Cardiovascular: Negative for chest pain, palpitations and leg swelling.  Gastrointestinal: Positive for blood in stool (Wants referral to GI for colonoscopy) and hematochezia. Negative for abdominal pain, constipation, diarrhea and vomiting.  Endocrine: Negative for polydipsia and polyuria.  Genitourinary: Negative for dysuria, frequency, genital sores, vaginal bleeding and vaginal discharge.  Musculoskeletal: Negative for arthralgias, gait problem and joint swelling.  Skin: Negative for color change and rash.  Neurological: Negative for dizziness, tremors, light-headedness and headaches.  Hematological: Negative for adenopathy. Does not bruise/bleed easily.  Psychiatric/Behavioral: Positive for depression. Negative for dysphoric mood and sleep disturbance. The patient is not nervous/anxious.     Patient Active Problem List  Diagnosis Date Noted  . Hyperlipidemia, mixed 02/24/2019  . Primary osteoarthritis 12/07/2017  . Current moderate episode of major depressive disorder without prior episode (HCC) 06/02/2017  . History of kidney stones  06/02/2017  . Vitiligo 06/02/2017  . Lichen planus 06/02/2017  . Environmental and seasonal allergies 06/02/2017  . Essential hypertension 05/09/2013    Allergies  Allergen Reactions  . Meperidine Nausea And Vomiting  . Propoxyphene Nausea And Vomiting    Past Surgical History:  Procedure Laterality Date  . CESAREAN SECTION  1985 &1991  . COLONOSCOPY  04/26/2007   [facility]- removed polyp  . NM MYOCAR PERF WALL MOTION  07/06/2007   no significant ischemia present  . US ECHOCARDIOGRAPHY  04/20/2007   normal  . vocal nodules  1978    Social History   Tobacco Use  . Smoking status: Never Smoker  . Smokeless tobacco: Never Used  Vaping Use  . Vaping Use: Never used  Substance Use Topics  . Alcohol use: Yes    Alcohol/week: 1.0 - 2.0 standard drink    Types: 1 - 2 Standard drinks or equivalent per week  . Drug use: No     Medication list has been reviewed and updated.  Current Meds  Medication Sig  . clobetasol ointment (TEMOVATE) 0.05 % APPLY OINTMENT TOPICALLY TO AFFECTED AREA TWICE DAILY  . escitalopram (LEXAPRO) 10 MG tablet Take 1 tablet (10 mg total) by mouth daily.  Marland Kitchen ketoconazole (NIZORAL) 2 % cream Apply 1 application topically daily.  Marland Kitchen lisinopril-hydrochlorothiazide (ZESTORETIC) 20-25 MG tablet Take 1 tablet by mouth daily.  . montelukast (SINGULAIR) 10 MG tablet TAKE 1 TABLET BY MOUTH AT BEDTIME  . naproxen sodium (ALEVE) 220 MG tablet Take 220 mg by mouth.  . pimecrolimus (ELIDEL) 1 % cream Apply topically 2 (two) times daily.    PHQ 2/9 Scores 02/27/2020 07/05/2019 02/22/2019 06/09/2018  PHQ - 2 Score 0 0 0 0  PHQ- 9 Score 0 - 3 -    GAD 7 : Generalized Anxiety Score 02/27/2020  Nervous, Anxious, on Edge 0  Control/stop worrying 0  Worry too much - different things 0  Trouble relaxing 0  Restless 0  Easily annoyed or irritable 0  Afraid - awful might happen 0  Total GAD 7 Score 0  Anxiety Difficulty Not difficult at all    BP Readings from  Last 3 Encounters:  02/27/20 122/64  07/05/19 124/78  02/22/19 128/64    Physical Exam Vitals and nursing note reviewed.  Constitutional:      General: She is not in acute distress.    Appearance: She is well-developed.  HENT:     Head: Normocephalic and atraumatic.     Right Ear: Tympanic membrane and ear canal normal.     Left Ear: Tympanic membrane and ear canal normal.     Nose:     Right Sinus: No maxillary sinus tenderness.     Left Sinus: No maxillary sinus tenderness.  Eyes:     General: No scleral icterus.       Right eye: No discharge.        Left eye: No discharge.     Conjunctiva/sclera: Conjunctivae normal.  Neck:     Thyroid: No thyromegaly.     Vascular: No carotid bruit.  Cardiovascular:     Rate and Rhythm: Normal rate and regular rhythm.     Pulses: Normal pulses.     Heart sounds: Normal heart sounds.  Pulmonary:  Effort: Pulmonary effort is normal. No respiratory distress.     Breath sounds: No wheezing.  Chest:     Breasts:        Right: No mass, nipple discharge, skin change or tenderness.        Left: No mass, nipple discharge, skin change or tenderness.  Abdominal:     General: Bowel sounds are normal.     Palpations: Abdomen is soft.     Tenderness: There is no abdominal tenderness.  Genitourinary:    Rectum: External hemorrhoid (bleeding) present.    Musculoskeletal:     Cervical back: Normal range of motion. No erythema.     Right lower leg: No edema.     Left lower leg: No edema.  Lymphadenopathy:     Cervical: No cervical adenopathy.  Skin:    General: Skin is warm and dry.     Findings: No rash.  Neurological:     Mental Status: She is alert and oriented to person, place, and time.     Cranial Nerves: No cranial nerve deficit.     Sensory: No sensory deficit.     Deep Tendon Reflexes: Reflexes are normal and symmetric.  Psychiatric:        Attention and Perception: Attention normal.        Mood and Affect: Mood normal.      Wt Readings from Last 3 Encounters:  02/27/20 179 lb (81.2 kg)  07/05/19 178 lb (80.7 kg)  02/22/19 176 lb (79.8 kg)    BP 122/64   Pulse 67   Temp 97.7 F (36.5 C) (Oral)   Ht 5\' 2"  (1.575 m)   Wt 179 lb (81.2 kg)   SpO2 94%   BMI 32.74 kg/m   Assessment and Plan: 1. Annual physical exam Exam is normal except for weight. Encourage regular exercise and appropriate dietary changes. - POCT urinalysis dipstick  2. Encounter for screening mammogram for breast cancer Ordered by GYN  3. Colon cancer screening - Ambulatory referral to Gastroenterology  4. Essential hypertension Clinically stable exam with well controlled BP on medication. Tolerating medications without side effects at this time. Pt to continue current regimen and low sodium diet; benefits of regular exercise as able discussed. - CBC with Differential/Platelet - Comprehensive metabolic panel - lisinopril-hydrochlorothiazide (ZESTORETIC) 20-25 MG tablet; Take 1 tablet by mouth daily.  Dispense: 90 tablet; Refill: 3  5. Current moderate episode of major depressive disorder without prior episode (HCC) Clinically stable on current regimen with good control of symptoms, No SI or HI. Will continue current therapy. - TSH - escitalopram (LEXAPRO) 10 MG tablet; Take 1 tablet (10 mg total) by mouth daily.  Dispense: 90 tablet; Refill: 3  6. Hyperlipidemia, mixed Very lipids in the past; intolerant of several statins Consider Zetia - Lipid panel  7. Bright red rectal bleeding Local care recommended - Ambulatory referral to Gastroenterology  8. Environmental and seasonal allergies - montelukast (SINGULAIR) 10 MG tablet; Take 1 tablet (10 mg total) by mouth at bedtime.  Dispense: 90 tablet; Refill: 1   Partially dictated using . Any errors are unintentional.  Animal nutritionist, MD [facility]  02/27/2020

## 2020-02-28 LAB — COMPREHENSIVE METABOLIC PANEL
ALT: 46 IU/L — ABNORMAL HIGH (ref 0–32)
AST: 27 IU/L (ref 0–40)
Albumin/Globulin Ratio: 2.3 — ABNORMAL HIGH (ref 1.2–2.2)
Albumin: 4.3 g/dL (ref 3.8–4.8)
Alkaline Phosphatase: 94 IU/L (ref 44–121)
BUN/Creatinine Ratio: 16 (ref 12–28)
BUN: 12 mg/dL (ref 8–27)
Bilirubin Total: 0.4 mg/dL (ref 0.0–1.2)
CO2: 23 mmol/L (ref 20–29)
Calcium: 9.4 mg/dL (ref 8.7–10.3)
Chloride: 103 mmol/L (ref 96–106)
Creatinine, Ser: 0.74 mg/dL (ref 0.57–1.00)
GFR calc Af Amer: 97 mL/min/{1.73_m2} (ref >59)
GFR calc non Af Amer: 84 mL/min/{1.73_m2} (ref >59)
Globulin, Total: 1.9 g/dL (ref 1.5–4.5)
Glucose: 99 mg/dL (ref 65–99)
Potassium: 4 mmol/L (ref 3.5–5.2)
Sodium: 141 mmol/L (ref 134–144)
Total Protein: 6.2 g/dL (ref 6.0–8.5)

## 2020-02-28 LAB — LIPID PANEL
Chol/HDL Ratio: 6.4 ratio — ABNORMAL HIGH (ref 0.0–4.4)
Cholesterol, Total: 274 mg/dL — ABNORMAL HIGH (ref 100–199)
HDL: 43 mg/dL (ref >39)
LDL Chol Calc (NIH): 192 mg/dL — ABNORMAL HIGH (ref 0–99)
Triglycerides: 206 mg/dL — ABNORMAL HIGH (ref 0–149)
VLDL Cholesterol Cal: 39 mg/dL (ref 5–40)

## 2020-02-28 LAB — CBC WITH DIFFERENTIAL/PLATELET
Basophils Absolute: 0.1 10*3/uL (ref 0.0–0.2)
Basos: 1 %
EOS (ABSOLUTE): 0.3 10*3/uL (ref 0.0–0.4)
Eos: 4 %
Hematocrit: 43.7 % (ref 34.0–46.6)
Hemoglobin: 14.9 g/dL (ref 11.1–15.9)
Immature Grans (Abs): 0 10*3/uL (ref 0.0–0.1)
Immature Granulocytes: 0 %
Lymphocytes Absolute: 2 10*3/uL (ref 0.7–3.1)
Lymphs: 27 %
MCH: 30.5 pg (ref 26.6–33.0)
MCHC: 34.1 g/dL (ref 31.5–35.7)
MCV: 89 fL (ref 79–97)
Monocytes Absolute: 0.8 10*3/uL (ref 0.1–0.9)
Monocytes: 10 %
Neutrophils Absolute: 4.3 10*3/uL (ref 1.4–7.0)
Neutrophils: 58 %
Platelets: 295 10*3/uL (ref 150–450)
RBC: 4.89 x10E6/uL (ref 3.77–5.28)
RDW: 12.2 % (ref 11.7–15.4)
WBC: 7.5 10*3/uL (ref 3.4–10.8)

## 2020-02-28 LAB — TSH: TSH: 2.62 u[IU]/mL (ref 0.450–4.500)

## 2020-02-28 NOTE — Progress Notes (Signed)
Patient informed. Said she wants to do some research on the medication - Zetia - and then call and let us know what she decides. Mailed her lab results and she requested with the name Zetia listed on the lab results.

## 2020-03-02 ENCOUNTER — Telehealth: Payer: Self-pay

## 2020-03-02 NOTE — Telephone Encounter (Unsigned)
Copied from CRM [id number]. Topic: Quick Communication - Lab Results (Clinic Use ONLY) >> Mar 02, 2020  3:27 PM Crist Infante wrote: Pt has a question about one of her lab results and would like Dr Asencion Partridge nurse to call her back

## 2020-03-02 NOTE — Telephone Encounter (Signed)
Pt called concerned about her O2 levels. Her sister is having some issues with cognitive ability. She remembers her O2, at last visit with Judithann Graves, being 94% and was worried that this could be an issue for her as well. I explained to her that she was wearing a mask when O2 was checked, this could cause the O2 to be lower than without the mask. She stated she was wearing it. I also went back to her 2019 chest xray and told her it looked good, but she MAY want to repeat this if she is still having some concerns about her breathing/ O2 levels.

## 2020-04-11 ENCOUNTER — Telehealth: Payer: Self-pay

## 2020-04-11 NOTE — Telephone Encounter (Unsigned)
Copied from CRM [id number]. Topic: General - Other >> Apr 11, 2020  3:55 PM Gwenlyn Fudge wrote: Reason for CRM: Pt called stating that she had a kidney infection while out of town. Pt states that she has been on an antibiotic for 7 days. She states that she is not feeling much better and that she cannot wait until PCP next appt. Please advise .

## 2020-04-13 ENCOUNTER — Ambulatory Visit
Admission: RE | Admit: 2020-04-13 | Discharge: 2020-04-13 | Disposition: A | Discharge status: Home / Self Care | Payer: No Typology Code available for payment source | Source: Ambulatory Visit | Attending: Family Medicine | Admitting: Family Medicine

## 2020-04-13 ENCOUNTER — Encounter: Payer: Self-pay | Admitting: Emergency Medicine

## 2020-04-13 ENCOUNTER — Telehealth: Payer: Self-pay | Admitting: Family Medicine

## 2020-04-13 ENCOUNTER — Ambulatory Visit (INDEPENDENT_AMBULATORY_CARE_PROVIDER_SITE_OTHER)
Admission: EM | Admit: 2020-04-13 | Discharge: 2020-04-13 | Disposition: A | Discharge status: Home / Self Care | Payer: No Typology Code available for payment source | Source: Home / Self Care | Attending: Family Medicine | Admitting: Family Medicine

## 2020-04-13 ENCOUNTER — Ambulatory Visit: Payer: Self-pay

## 2020-04-13 ENCOUNTER — Other Ambulatory Visit: Payer: Self-pay

## 2020-04-13 DIAGNOSIS — N201 Calculus of ureter: Secondary | ICD-10-CM

## 2020-04-13 DIAGNOSIS — N1330 Unspecified hydronephrosis: Secondary | ICD-10-CM

## 2020-04-13 DIAGNOSIS — E876 Hypokalemia: Secondary | ICD-10-CM | POA: Insufficient documentation

## 2020-04-13 DIAGNOSIS — N179 Acute kidney failure, unspecified: Secondary | ICD-10-CM | POA: Insufficient documentation

## 2020-04-13 LAB — COMPREHENSIVE METABOLIC PANEL
ALT: 27 U/L (ref 0–44)
AST: 23 U/L (ref 15–41)
Albumin: 3.3 g/dL — ABNORMAL LOW (ref 3.5–5.0)
Alkaline Phosphatase: 96 U/L (ref 38–126)
Anion gap: 12 (ref 5–15)
BUN: 24 mg/dL — ABNORMAL HIGH (ref 8–23)
CO2: 27 mmol/L (ref 22–32)
Calcium: 9.2 mg/dL (ref 8.9–10.3)
Chloride: 93 mmol/L — ABNORMAL LOW (ref 98–111)
Creatinine, Ser: 1.12 mg/dL — ABNORMAL HIGH (ref 0.44–1.00)
GFR, Estimated: 54 mL/min — ABNORMAL LOW (ref >60)
Glucose, Bld: 123 mg/dL — ABNORMAL HIGH (ref 70–99)
Potassium: 2.9 mmol/L — ABNORMAL LOW (ref 3.5–5.1)
Sodium: 132 mmol/L — ABNORMAL LOW (ref 135–145)
Total Bilirubin: 0.7 mg/dL (ref 0.3–1.2)
Total Protein: 7.5 g/dL (ref 6.5–8.1)

## 2020-04-13 LAB — URINALYSIS, COMPLETE (UACMP) WITH MICROSCOPIC
Bilirubin Urine: NEGATIVE
Glucose, UA: NEGATIVE mg/dL
Ketones, ur: NEGATIVE mg/dL
Leukocytes,Ua: NEGATIVE
Nitrite: NEGATIVE
Protein, ur: NEGATIVE mg/dL
Specific Gravity, Urine: >1.03 — ABNORMAL HIGH (ref 1.005–1.030)
pH: 5 (ref 5.0–8.0)

## 2020-04-13 LAB — CBC WITH DIFFERENTIAL/PLATELET
Abs Immature Granulocytes: 0.14 10*3/uL — ABNORMAL HIGH (ref 0.00–0.07)
Basophils Absolute: 0.1 10*3/uL (ref 0.0–0.1)
Basophils Relative: 1 %
Eosinophils Absolute: 0.3 10*3/uL (ref 0.0–0.5)
Eosinophils Relative: 2 %
HCT: 43.2 % (ref 36.0–46.0)
Hemoglobin: 14.9 g/dL (ref 12.0–15.0)
Immature Granulocytes: 1 %
Lymphocytes Relative: 19 %
Lymphs Abs: 2.4 10*3/uL (ref 0.7–4.0)
MCH: 29.4 pg (ref 26.0–34.0)
MCHC: 34.5 g/dL (ref 30.0–36.0)
MCV: 85.2 fL (ref 80.0–100.0)
Monocytes Absolute: 1.1 10*3/uL — ABNORMAL HIGH (ref 0.1–1.0)
Monocytes Relative: 9 %
Neutro Abs: 8.9 10*3/uL — ABNORMAL HIGH (ref 1.7–7.7)
Neutrophils Relative %: 68 %
Platelets: 418 10*3/uL — ABNORMAL HIGH (ref 150–400)
RBC: 5.07 MIL/uL (ref 3.87–5.11)
RDW: 11.9 % (ref 11.5–15.5)
WBC: 12.9 10*3/uL — ABNORMAL HIGH (ref 4.0–10.5)
nRBC: 0 % (ref 0.0–0.2)

## 2020-04-13 MED ORDER — POTASSIUM CHLORIDE CRYS ER 20 MEQ PO TBCR: 20.0000 meq | EXTENDED_RELEASE_TABLET | Freq: Two times a day (BID) | ORAL | 0 refills | Status: DC

## 2020-04-13 MED ORDER — TAMSULOSIN HCL 0.4 MG PO CAPS: 0.4000 mg | ORAL_CAPSULE | Freq: Every day | ORAL | 0 refills | Status: DC

## 2020-04-13 MED ORDER — HYDROCODONE-ACETAMINOPHEN 5-325 MG PO TABS: 1.0000 | ORAL_TABLET | Freq: Three times a day (TID) | ORAL | 0 refills | Status: DC | PRN

## 2020-04-13 NOTE — Discharge Instructions (Signed)
Appt is at [address], [place], [place] [address]  I will call with the results.  Take care  Dr. Adriana Simas

## 2020-04-13 NOTE — Telephone Encounter (Signed)
Patient given information regarding CT results. Advised to push fluids.  Will strain urine.  Patient returning to pick up strainer today. Pain medication as needed.  [place] on a controlled substance database reviewed. Flomax as prescribed. K-Dur for hypokalemia.   Everlene Other DO [facility]

## 2020-04-13 NOTE — ED Provider Notes (Signed)
[facility]    CSN: [id number] Arrival date & time: 04/13/20  9562      History   Chief Complaint Chief Complaint  Patient presents with  . Back Pain   HPI  69 year old female presents with back pain, flank pain.  Patient reports that her symptoms started over a week ago.  She has been experiencing left-sided flank pain.  She went to an urgent care and was prescribed Cipro for reported UTI.  Patient reports that she has had fever, T-max 100.8, chills, nausea, vomiting.  Has had ongoing flank pain.  Has had urinary frequency as well.  Patient states that she has improved since being placed on Cipro but has not fully resolved.  She is currently afebrile.  Patient reports a history of kidney stones.  No known exacerbating factors.  Pain currently 2/10 in severity.  Past Medical History:  Diagnosis Date  . Arthritis    Joints arthritis.  . Depression   . History of kidney stones   . Hyperlipidemia   . Hypertension     Patient Active Problem List   Diagnosis Date Noted  . Hyperlipidemia, mixed 02/24/2019  . Primary osteoarthritis 12/07/2017  . Current moderate episode of major depressive disorder without prior episode (HCC) 06/02/2017  . History of kidney stones 06/02/2017  . Vitiligo 06/02/2017  . Lichen planus 06/02/2017  . Environmental and seasonal allergies 06/02/2017  . Essential hypertension 05/09/2013    Past Surgical History:  Procedure Laterality Date  . CESAREAN SECTION  1985 &1991  . COLONOSCOPY  04/26/2007   [facility]- removed polyp  . NM MYOCAR PERF WALL MOTION  07/06/2007   no significant ischemia present  . US ECHOCARDIOGRAPHY  04/20/2007   normal  . vocal nodules  1978    OB History   No obstetric history on file.      Home Medications    Prior to Admission medications   Medication Sig Start Date End Date Taking? Authorizing Provider  escitalopram (LEXAPRO) 10 MG tablet Take 1 tablet (10 mg total) by mouth daily. 02/27/20  Yes  Reubin Milan, MD  lisinopril-hydrochlorothiazide (ZESTORETIC) 20-25 MG tablet Take 1 tablet by mouth daily. 02/27/20  Yes Reubin Milan, MD  montelukast (SINGULAIR) 10 MG tablet Take 1 tablet (10 mg total) by mouth at bedtime. 02/27/20  Yes Reubin Milan, MD  clobetasol ointment (TEMOVATE) 0.05 % APPLY OINTMENT TOPICALLY TO AFFECTED AREA TWICE DAILY 02/24/18   [provider]  HYDROcodone-acetaminophen (NORCO/VICODIN) 5-325 MG tablet Take 1 tablet by mouth every 8 (eight) hours as needed for moderate pain. 04/13/20   Tommie Sams, DO  ketoconazole (NIZORAL) 2 % cream Apply 1 application topically daily.    [provider]  naproxen sodium (ALEVE) 220 MG tablet Take 220 mg by mouth.    [provider]  pimecrolimus (ELIDEL) 1 % cream Apply topically 2 (two) times daily.    [provider]  potassium chloride SA (KLOR-CON) 20 MEQ tablet Take 1 tablet (20 mEq total) by mouth 2 (two) times daily for 4 days. 04/13/20 04/17/20  Tommie Sams, DO  tamsulosin (FLOMAX) 0.4 MG CAPS capsule Take 1 capsule (0.4 mg total) by mouth daily. 04/13/20   Tommie Sams, DO    Family History Family History  Problem Relation Age of Onset  . Hypertension Mother   . Heart failure Father   . Aneurysm Maternal Grandfather   . Heart attack Paternal Grandfather   . Cancer Brother  Social History Social History   Tobacco Use  . Smoking status: Never Smoker  . Smokeless tobacco: Never Used  Vaping Use  . Vaping Use: Never used  Substance Use Topics  . Alcohol use: Yes    Alcohol/week: 1.0 - 2.0 standard drink    Types: 1 - 2 Standard drinks or equivalent per week  . Drug use: No     Allergies   Meperidine and Propoxyphene   Review of Systems Review of Systems Per HPI  Physical Exam Triage Vital Signs ED Triage Vitals  Enc Vitals Group     BP 04/13/20 1014 121/64     Pulse Rate 04/13/20 1014 74     Resp 04/13/20 1014 14     Temp 04/13/20 1014  97.9 F (36.6 C)     Temp Source 04/13/20 1014 Oral     SpO2 04/13/20 1014 98 %     Weight 04/13/20 1011 170 lb (77.1 kg)     Height 04/13/20 1011 5\' 2"  (1.575 m)     Head Circumference --      Peak Flow --      Pain Score 04/13/20 1011 2     Pain Loc --      Pain Edu? --      Excl. in GC? --    Updated Vital Signs BP 121/64 (BP Location: Left Arm)   Pulse 74   Temp 97.9 F (36.6 C) (Oral)   Resp 14   Ht 5\' 2"  (1.575 m)   Wt 77.1 kg   SpO2 98%   BMI 31.09 kg/m   Visual Acuity Right Eye Distance:   Left Eye Distance:   Bilateral Distance:    Right Eye Near:   Left Eye Near:    Bilateral Near:     Physical Exam Constitutional:      General: She is not in acute distress.    Appearance: Normal appearance. She is not ill-appearing.  HENT:     Head: Normocephalic and atraumatic.  Cardiovascular:     Rate and Rhythm: Normal rate and regular rhythm.  Pulmonary:     Effort: Pulmonary effort is normal.     Breath sounds: Normal breath sounds. No wheezing or rales.  Abdominal:     General: There is no distension.     Palpations: Abdomen is soft.     Comments: Mild left lower quadrant tenderness.  Left CVA tenderness.  Neurological:     Mental Status: She is alert.  Psychiatric:        Mood and Affect: Mood normal.        Behavior: Behavior normal.    UC Treatments / Results  Labs (all labs ordered are listed, but only abnormal results are displayed) Labs Reviewed  URINALYSIS, COMPLETE (UACMP) WITH MICROSCOPIC - Abnormal; Notable for the following components:      Result Value   APPearance HAZY (*)    Specific Gravity, Urine >1.030 (*)    Hgb urine dipstick SMALL (*)    Bacteria, UA MANY (*)    All other components within normal limits  CBC WITH DIFFERENTIAL/PLATELET - Abnormal; Notable for the following components:   WBC 12.9 (*)    Platelets 418 (*)    Neutro Abs 8.9 (*)    Monocytes Absolute 1.1 (*)    Abs Immature Granulocytes 0.14 (*)    All other  components within normal limits  COMPREHENSIVE METABOLIC PANEL - Abnormal; Notable for the following components:   Sodium 132 (*)  Potassium 2.9 (*)    Chloride 93 (*)    Glucose, Bld 123 (*)    BUN 24 (*)    Creatinine, Ser 1.12 (*)    Albumin 3.3 (*)    GFR, Estimated 54 (*)    All other components within normal limits  URINE CULTURE    EKG   Radiology CT ABDOMEN PELVIS WO CONTRAST  Addendum Date: 04/13/2020   ADDENDUM REPORT: 04/13/2020 14:57 ADDENDUM: On additional review of prior study from 2018, there was prominence in this area on the 2018 study, this area appears somewhat more prominent currently which warrants further assessment as noted. Note that it may well represent localized liver prominent without focal lesion, but the configuration is unusually lobular in this area. Electronically Signed   By: Bretta Bang III M.D.   On: 04/13/2020 14:57   Result Date: 04/13/2020 CLINICAL DATA:  Left flank pain and hematuria EXAM: CT ABDOMEN AND PELVIS WITHOUT CONTRAST TECHNIQUE: Multidetector CT imaging of the abdomen and pelvis was performed following the standard protocol without oral or IV contrast. COMPARISON:  April 15, 2017. FINDINGS: Lower chest: Lung bases are clear. Hepatobiliary: There is a masslike area arising at the level of the posteroinferior liver measuring 5.1 x 4.4 cm which was not present on previous study and has attenuation values on noncontrast enhanced study is centrally identical to the remainder of the liver. Beyond this new area of opacity along the inferior liver, no focal liver lesions are evident, and the liver appears stable compared to the previous study. There is no gallbladder wall thickening or pericholecystic fluid. Gallbladder wall is not appreciably thickened. There is no biliary duct dilatation. Pancreas: There is no pancreatic mass or inflammatory focus Spleen: No splenic lesions are evident. Adrenals/Urinary Tract: Adrenals bilaterally appear  normal. There is no appreciable renal mass on either side. There is slight hydronephrosis on the left. There is no hydronephrosis on the right. There is no intrarenal calculus on either side. There is a 2 mm calculus in the inferior bladder which represents either a calculus in the left ureterovesical junction or a recently passed calculus just beyond the left ureterovesical junction. This calcification was not present on prior study. There is a 2 mm calcification in the mid sacral region which was not present previously, concerning for a second small calculus in the left ureter. Urinary bladder wall thickness normal. Stomach/Bowel: There is no appreciable bowel wall or mesenteric thickening. There are occasional sigmoid diverticula without diverticulitis. No evident bowel obstruction. Terminal ileum appears normal. No evident free air or portal venous air. Vascular/Lymphatic: No abdominal aortic aneurysm. There are foci of aortic and iliac artery atherosclerosis. No adenopathy is evident in the abdomen or pelvis. Reproductive: The uterus is anteverted. No adnexal masses are evident. There is dilatation of the left ovarian vein with a calcification in the pelvis within this vein measuring 1.0 x 1.0 cm. This calcification is near but felt to be separate from the distal ureter. This calcification was present on the 2018 study. Other: Appendix appears normal. No evident abscess or ascites in the abdomen or pelvis. Musculoskeletal: There are foci of degenerative change in the lumbar spine. No blastic or lytic bone lesions. No intramuscular or abdominal wall lesions are evident. IMPRESSION: 1. Mild hydronephrosis on the left. 2 mm calcification in the inferior bladder either in the distal most aspect of the left ureterovesical junction or slightly beyond the ureterovesical junction, recently passed. A second calcification is noted at the mid sacral level  measuring 2 mm, suspicious for a second ureteral calculus. 2. There  is a calcification measuring 1 cm which appears with in a dilated left ovarian vein, located immediately adjacent to the distal left ureter. This calcification was present on the 2018 study. 3. There is a questionable mass arising from the inferior liver on the right which was not present previously. This lesion on noncontrast enhanced study has attenuation values essentially identical to the remainder of the liver. If the patient has had trauma in this area, regenerative type nodule could present in this manner. Given this change from the previous study, further evaluation of the liver is felt to be warranted. MR pre and post-contrast of the liver would be the optimum study to further evaluate in this regard. If there is a contraindication to MR, post-contrast CT of the liver would be an alternative study to further evaluate this area. Liver otherwise appears unchanged from prior study. 4. Occasional sigmoid diverticula without diverticulitis. No bowel wall thickening or bowel obstruction. No abscess in the abdomen or pelvis. Appendix region appears normal. 5.  Aortic Atherosclerosis (ICD10-I70.0). These results will be called to the ordering clinician or representative by the Radiologist Assistant, and communication documented in the PACS or [employer]. Electronically Signed: By: Bretta Bang III M.D. On: 04/13/2020 14:31    Procedures Procedures (including critical care time)  Medications Ordered in UC Medications - No data to display  Initial Impression / Assessment and Plan / UC Course  I have reviewed the triage vital signs and the nursing notes.  Pertinent labs & imaging results that were available during my care of the patient were reviewed by me and considered in my medical decision making (see chart for details).    69 year old female presents with ongoing flank pain and recent nausea, vomiting, fever.  WBC count mildly elevated at 12.9.  Mild AKI with a creatinine of 1.12.   Hypokalemia and hyponatremia noted as well.  CT scan of the abdomen and pelvis was done.  It revealed 2 small stones, one in the bladder and one in the ureter.  It also revealed a enlarging liver mass.  This was all discussed with the patient.  Rx was sent for Flomax, K. Dur, and Vicodin.  Push fluids.  Strain urine.  Will need outpatient follow-up regarding MRI the abdomen for liver mass.  **See telephone note**  Final Clinical Impressions(s) / UC Diagnoses   Final diagnoses:  Ureterolithiasis  Hydronephrosis, unspecified hydronephrosis type  Hypokalemia  AKI (acute kidney injury) [facility])     Discharge Instructions     Appt is at [address], [place], [place] [address]  I will call with the results.  Take care  Dr. Adriana Simas     ED Prescriptions    None     I have reviewed the PDMP during this encounter.   Tommie Sams, [place] 04/13/20 1536

## 2020-04-13 NOTE — ED Triage Notes (Signed)
Patient c/o left sided lower back and lower abdominal pain that started a week ago.  Patient states that she was started on Cipro for UTI on Saturday.  Patient denies fevers. Patient reports chills.

## 2020-04-15 LAB — URINE CULTURE: Culture: NO GROWTH

## 2020-04-19 ENCOUNTER — Encounter: Payer: Self-pay | Admitting: Internal Medicine

## 2020-04-19 ENCOUNTER — Other Ambulatory Visit: Payer: Self-pay

## 2020-04-19 ENCOUNTER — Ambulatory Visit (INDEPENDENT_AMBULATORY_CARE_PROVIDER_SITE_OTHER): Payer: PRIVATE HEALTH INSURANCE | Admitting: Internal Medicine

## 2020-04-19 VITALS — BP 118/72 | HR 67 | Temp 97.9°F | Ht 62.0 in | Wt 171.0 lb

## 2020-04-19 DIAGNOSIS — R160 Hepatomegaly, not elsewhere classified: Secondary | ICD-10-CM

## 2020-04-19 DIAGNOSIS — F321 Major depressive disorder, single episode, moderate: Secondary | ICD-10-CM | POA: Diagnosis not present

## 2020-04-19 DIAGNOSIS — I10 Essential (primary) hypertension: Secondary | ICD-10-CM | POA: Diagnosis not present

## 2020-04-19 DIAGNOSIS — E876 Hypokalemia: Secondary | ICD-10-CM | POA: Diagnosis not present

## 2020-04-19 MED ORDER — POTASSIUM CHLORIDE ER 10 MEQ PO TBCR: 10.0000 meq | EXTENDED_RELEASE_TABLET | Freq: Every day | ORAL | 0 refills | Status: DC

## 2020-04-19 NOTE — Progress Notes (Signed)
m   Date:  04/19/2020   Name:  Emily Pena   DOB:  12/04/1950   MRN:  [id number]   Chief Complaint: Depression, Hypertension, and Referral (MRI- went to UC there was a growth on liver Dr. Adriana Simas told pt to see PCP for MRI)  Depression        This is a chronic problem.  The problem has been resolved since onset.  Associated symptoms include no fatigue and no headaches.     The symptoms are aggravated by nothing.  Past treatments include SSRIs - Selective serotonin reuptake inhibitors.  Compliance with treatment is good. Hypertension This is a chronic problem. The problem is controlled. Pertinent negatives include no chest pain, headaches, palpitations, peripheral edema or shortness of breath. There are no associated agents to hypertension. Past treatments include ACE inhibitors and diuretics. The current treatment provides significant improvement. There are no compliance problems.   UTI/ stone - seen in UC - had a bladder stone and a left ureteral stone.  Treated the infection and feels much better but there was a questionable liver abnormality.  She also had low potassium and took a supplement for a few days.  Now eating more bananas.  Mild muscle aches almost like a cramp. Liver mass:  Non contrast CT in UC for kidney stone. 3. There is a questionable mass arising from the inferior liver on the right which was not present previously. This lesion on noncontrast enhanced study has attenuation values essentially identical to the remainder of the liver. If the patient has had trauma in this area, regenerative type nodule could present in this manner. Given this change from the previous study, further evaluation of the liver is felt to be warranted. MR pre and post-contrast of the liver would be the optimum study to further evaluate in this regard. If there is a contraindication to MR, post-contrast CT of the liver would be an alternative study to further evaluate this area. Liver otherwise  appears unchanged from prior study. Lab Results  Component Value Date   CREATININE 1.12 (H) 04/13/2020   BUN 24 (H) 04/13/2020   NA 132 (L) 04/13/2020   K 2.9 (L) 04/13/2020   CL 93 (L) 04/13/2020   CO2 27 04/13/2020   Lab Results  Component Value Date   CHOL 274 (H) 02/27/2020   HDL 43 02/27/2020   LDLCALC 192 (H) 02/27/2020   TRIG 206 (H) 02/27/2020   CHOLHDL 6.4 (H) 02/27/2020   Lab Results  Component Value Date   TSH 2.620 02/27/2020   No results found for: HGBA1C Lab Results  Component Value Date   WBC 12.9 (H) 04/13/2020   HGB 14.9 04/13/2020   HCT 43.2 04/13/2020   MCV 85.2 04/13/2020   PLT 418 (H) 04/13/2020   Lab Results  Component Value Date   ALT 27 04/13/2020   AST 23 04/13/2020   ALKPHOS 96 04/13/2020   BILITOT 0.7 04/13/2020     Review of Systems  Constitutional: Negative for chills, fatigue and fever.  Respiratory: Negative for chest tightness and shortness of breath.   Cardiovascular: Negative for chest pain and palpitations.  Gastrointestinal: Negative for abdominal pain, blood in stool, constipation and diarrhea.  Neurological: Negative for dizziness and headaches.  Psychiatric/Behavioral: Positive for depression. Negative for dysphoric mood. The patient is not nervous/anxious.     Patient Active Problem List   Diagnosis Date Noted  . Hyperlipidemia, mixed 02/24/2019  . Primary osteoarthritis 12/07/2017  . Current moderate episode of  major depressive disorder without prior episode (HCC) 06/02/2017  . History of kidney stones 06/02/2017  . Vitiligo 06/02/2017  . Lichen planus 06/02/2017  . Environmental and seasonal allergies 06/02/2017  . Essential hypertension 05/09/2013    Allergies  Allergen Reactions  . Meperidine Nausea And Vomiting  . Propoxyphene Nausea And Vomiting    Past Surgical History:  Procedure Laterality Date  . CESAREAN SECTION  1985 &1991  . COLONOSCOPY  04/26/2007   [facility]- removed polyp  . NM MYOCAR  PERF WALL MOTION  07/06/2007   no significant ischemia present  . US ECHOCARDIOGRAPHY  04/20/2007   normal  . vocal nodules  1978    Social History   Tobacco Use  . Smoking status: Never Smoker  . Smokeless tobacco: Never Used  Vaping Use  . Vaping Use: Never used  Substance Use Topics  . Alcohol use: Yes    Alcohol/week: 1.0 - 2.0 standard drink    Types: 1 - 2 Standard drinks or equivalent per week  . Drug use: No     Medication list has been reviewed and updated.  Current Meds  Medication Sig  . clobetasol ointment (TEMOVATE) 0.05 % APPLY OINTMENT TOPICALLY TO AFFECTED AREA TWICE DAILY  . escitalopram (LEXAPRO) 10 MG tablet Take 1 tablet (10 mg total) by mouth daily.  Marland Kitchen ketoconazole (NIZORAL) 2 % cream Apply 1 application topically daily.  Marland Kitchen lisinopril-hydrochlorothiazide (ZESTORETIC) 20-25 MG tablet Take 1 tablet by mouth daily.  . montelukast (SINGULAIR) 10 MG tablet Take 1 tablet (10 mg total) by mouth at bedtime.    PHQ 2/9 Scores 04/19/2020 02/27/2020 07/05/2019 02/22/2019  PHQ - 2 Score 0 0 0 0  PHQ- 9 Score 0 0 - 3    GAD 7 : Generalized Anxiety Score 04/19/2020 02/27/2020  Nervous, Anxious, on Edge 0 0  Control/stop worrying 0 0  Worry too much - different things 0 0  Trouble relaxing 0 0  Restless 0 0  Easily annoyed or irritable 0 0  Afraid - awful might happen 0 0  Total GAD 7 Score 0 0  Anxiety Difficulty - Not difficult at all    BP Readings from Last 3 Encounters:  04/19/20 118/72  04/13/20 121/64  02/27/20 122/64    Physical Exam Vitals and nursing note reviewed.  Constitutional:      General: She is not in acute distress.    Appearance: Normal appearance. She is well-developed.  HENT:     Head: Normocephalic and atraumatic.  Cardiovascular:     Rate and Rhythm: Normal rate and regular rhythm.     Pulses: Normal pulses.     Heart sounds: No murmur heard.   Pulmonary:     Effort: Pulmonary effort is normal. No respiratory distress.      Breath sounds: No wheezing or rhonchi.  Abdominal:     Palpations: Abdomen is soft. There is no mass.     Tenderness: There is no abdominal tenderness. There is no right CVA tenderness, left CVA tenderness or guarding.  Musculoskeletal:     Cervical back: Normal range of motion.     Right lower leg: No edema.     Left lower leg: No edema.  Lymphadenopathy:     Cervical: No cervical adenopathy.  Skin:    General: Skin is warm and dry.     Capillary Refill: Capillary refill takes less than 2 seconds.     Findings: No rash.  Neurological:     General: No focal  deficit present.     Mental Status: She is alert and oriented to person, place, and time.  Psychiatric:        Mood and Affect: Mood and affect and mood normal.     Wt Readings from Last 3 Encounters:  04/19/20 171 lb (77.6 kg)  04/13/20 170 lb (77.1 kg)  02/27/20 179 lb (81.2 kg)    BP 118/72   Pulse 67   Temp 97.9 F (36.6 C) (Oral)   Ht 5\' 2"  (1.575 m)   Wt 171 lb (77.6 kg)   SpO2 97%   BMI 31.28 kg/m   Assessment and Plan: 1. Liver mass Questionable mass on CT stone study - compared to 2018 the area appears slightly larger. Pt is asymptomatic with no hx of liver trauma to explain a regenerative process Will evaluate further with MRI, repeat CMP - Comprehensive metabolic panel - MR LIVER W WO CONTRAST; Future  2. Hypokalemia Supplement daily for one month HCTZ therapy may be contributing but K+ has previously been normal. Follow up as needed - potassium chloride (KLOR-CON) 10 MEQ tablet; Take 1 tablet (10 mEq total) by mouth daily.  Dispense: 30 tablet; Refill: 0 - Comprehensive metabolic panel  3. Essential hypertension Clinically stable exam with well controlled BP on lisinopril hct. Tolerating medications without side effects at this time.  Will not change unless low K+ is persistent Pt to continue current regimen and low sodium diet; benefits of regular exercise as able discussed.  4. Current  moderate episode of major depressive disorder without prior episode (HCC) Clinically stable on current regimen with good control of symptoms, No SI or HI. Will continue current therapy.   Partially dictated using 2019. Any errors are unintentional.  Animal nutritionist, MD [facility]  04/19/2020

## 2020-04-20 LAB — COMPREHENSIVE METABOLIC PANEL
ALT: 24 IU/L (ref 0–32)
AST: 18 IU/L (ref 0–40)
Albumin/Globulin Ratio: 1.8 (ref 1.2–2.2)
Albumin: 4.1 g/dL (ref 3.8–4.8)
Alkaline Phosphatase: 100 IU/L (ref 44–121)
BUN/Creatinine Ratio: 17 (ref 12–28)
BUN: 12 mg/dL (ref 8–27)
Bilirubin Total: 0.2 mg/dL (ref 0.0–1.2)
CO2: 24 mmol/L (ref 20–29)
Calcium: 9.7 mg/dL (ref 8.7–10.3)
Chloride: 102 mmol/L (ref 96–106)
Creatinine, Ser: 0.71 mg/dL (ref 0.57–1.00)
GFR calc Af Amer: 102 mL/min/{1.73_m2} (ref >59)
GFR calc non Af Amer: 88 mL/min/{1.73_m2} (ref >59)
Globulin, Total: 2.3 g/dL (ref 1.5–4.5)
Glucose: 61 mg/dL — ABNORMAL LOW (ref 65–99)
Potassium: 4.7 mmol/L (ref 3.5–5.2)
Sodium: 143 mmol/L (ref 134–144)
Total Protein: 6.4 g/dL (ref 6.0–8.5)

## 2020-05-02 ENCOUNTER — Ambulatory Visit: Payer: PRIVATE HEALTH INSURANCE

## 2020-05-08 ENCOUNTER — Ambulatory Visit: Payer: PRIVATE HEALTH INSURANCE

## 2020-05-09 ENCOUNTER — Ambulatory Visit: Payer: PRIVATE HEALTH INSURANCE

## 2020-05-10 ENCOUNTER — Telehealth: Payer: Self-pay

## 2020-05-10 ENCOUNTER — Other Ambulatory Visit: Payer: Self-pay

## 2020-05-10 DIAGNOSIS — Z1211 Encounter for screening for malignant neoplasm of colon: Secondary | ICD-10-CM

## 2020-05-10 NOTE — Telephone Encounter (Signed)
Gastroenterology Pre-Procedure Review  Request Date: Monday 07/09/20 Requesting Physician: Dr. Allegra Lai  PATIENT REVIEW QUESTIONS: The patient responded to the following health history questions as indicated:    1. Are you having any GI issues? no 2. Do you have a personal history of Polyps? no 3. Do you have a family history of Colon Cancer or Polyps? yes (paternal uncle colon cancer) 4. Diabetes Mellitus? no 5. Joint replacements in the past 12 months?no 6. Major health problems in the past 3 months?no 7. Any artificial heart valves, MVP, or defibrillator?no    MEDICATIONS & ALLERGIES:    Patient reports the following regarding taking any anticoagulation/antiplatelet therapy:   Plavix, Coumadin, Eliquis, Xarelto, Lovenox, Pradaxa, Brilinta, or Effient? no Aspirin? no  Patient confirms/reports the following medications:  Current Outpatient Medications  Medication Sig Dispense Refill  . clobetasol ointment (TEMOVATE) 0.05 % APPLY OINTMENT TOPICALLY TO AFFECTED AREA TWICE DAILY    . escitalopram (LEXAPRO) 10 MG tablet Take 1 tablet (10 mg total) by mouth daily. 90 tablet 3  . HYDROcodone-acetaminophen (NORCO/VICODIN) 5-325 MG tablet Take 1 tablet by mouth every 8 (eight) hours as needed for moderate pain. (Patient not taking: Reported on 04/19/2020) 10 tablet 0  . ketoconazole (NIZORAL) 2 % cream Apply 1 application topically daily.    Marland Kitchen lisinopril-hydrochlorothiazide (ZESTORETIC) 20-25 MG tablet Take 1 tablet by mouth daily. 90 tablet 3  . montelukast (SINGULAIR) 10 MG tablet Take 1 tablet (10 mg total) by mouth at bedtime. 90 tablet 1  . potassium chloride (KLOR-CON) 10 MEQ tablet Take 1 tablet (10 mEq total) by mouth daily. 30 tablet 0  . tamsulosin (FLOMAX) 0.4 MG CAPS capsule Take 1 capsule (0.4 mg total) by mouth daily. (Patient not taking: Reported on 04/19/2020) 30 capsule 0   No current facility-administered medications for this visit.    Patient confirms/reports the following  allergies:  Allergies  Allergen Reactions  . Meperidine Nausea And Vomiting  . Propoxyphene Nausea And Vomiting    No orders of the defined types were placed in this encounter.   AUTHORIZATION INFORMATION Primary Insurance: 1D#: Group #:  Secondary Insurance: 1D#: Group #:  SCHEDULE INFORMATION: Date: 07/09/20 Time: Location:[facility]

## 2020-05-11 ENCOUNTER — Ambulatory Visit: Payer: PRIVATE HEALTH INSURANCE | Admitting: Gastroenterology

## 2020-05-15 ENCOUNTER — Other Ambulatory Visit: Payer: Self-pay | Admitting: Internal Medicine

## 2020-05-15 ENCOUNTER — Ambulatory Visit: Admission: RE | Admit: 2020-05-15 | Payer: PRIVATE HEALTH INSURANCE | Source: Ambulatory Visit

## 2020-05-15 DIAGNOSIS — R160 Hepatomegaly, not elsewhere classified: Secondary | ICD-10-CM

## 2020-05-16 ENCOUNTER — Telehealth: Payer: Self-pay

## 2020-05-16 NOTE — Telephone Encounter (Signed)
Called pt and left VM asking pt about her cancelled MRI. Wanted to know if pt has already rescheduled or the reason for the cancellation.  Will route result note to [facility] Nurse Triage for follow up when patient returns call to clinic. Nurse may give results to patient if they return call. CRM created for this message.   KP

## 2020-05-23 ENCOUNTER — Other Ambulatory Visit: Payer: Self-pay | Admitting: Internal Medicine

## 2020-05-23 DIAGNOSIS — R160 Hepatomegaly, not elsewhere classified: Secondary | ICD-10-CM

## 2020-05-30 ENCOUNTER — Other Ambulatory Visit: Payer: Self-pay

## 2020-05-30 ENCOUNTER — Ambulatory Visit
Admission: RE | Admit: 2020-05-30 | Discharge: 2020-05-30 | Disposition: A | Discharge status: Home / Self Care | Payer: PRIVATE HEALTH INSURANCE | Source: Ambulatory Visit | Attending: Internal Medicine | Admitting: Internal Medicine

## 2020-05-30 DIAGNOSIS — R160 Hepatomegaly, not elsewhere classified: Secondary | ICD-10-CM | POA: Insufficient documentation

## 2020-05-30 MED ORDER — GADOBUTROL 1 MMOL/ML IV SOLN
7.0000 mL | Freq: Once | INTRAVENOUS | Status: AC | PRN
  Administered 2020-05-30: 7 mL via INTRAVENOUS

## 2020-05-31 ENCOUNTER — Encounter: Payer: Self-pay | Admitting: Internal Medicine

## 2020-05-31 DIAGNOSIS — I700 Atherosclerosis of aorta: Secondary | ICD-10-CM | POA: Insufficient documentation

## 2020-05-31 DIAGNOSIS — K760 Fatty (change of) liver, not elsewhere classified: Secondary | ICD-10-CM | POA: Insufficient documentation

## 2020-05-31 NOTE — Progress Notes (Signed)
Called pt read her the note per Dr. 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 No further testing for this is needed. However, the MRI noted fatty liver and atherosclerosis of the aorta. Treatment of the fatty liver is low fat diet/weight loss. I strongly recommend starting a statin to slow progression of atherosclerosis in the aorta and other arteries." Pt verbalized understanding.  KP

## 2020-06-15 ENCOUNTER — Ambulatory Visit: Payer: PRIVATE HEALTH INSURANCE | Admitting: Gastroenterology

## 2020-08-02 ENCOUNTER — Telehealth: Payer: Self-pay

## 2020-08-02 NOTE — Telephone Encounter (Signed)
Copied from CRM [id number]. Topic: General - Inquiry >> Aug 02, 2020 12:56 PM Aretta Nip wrote: Emily Pena with [facility] [place] had needed some additional info on her pt. Could Dr B fax over the CT AB scan done on 04/13/2020 and send the MR Liver w/wo contrast done 05/30/20. Please over to [place] at [phone]

## 2020-08-03 NOTE — Telephone Encounter (Signed)
Called patient and left a VM informing that we are receiving a records request from a new PCP in [place]. Called to verify if she changed PCP's and if it is verbally okay to send these records to their clinic.  Waiting call back.

## 2020-08-08 NOTE — Telephone Encounter (Signed)
Emily Pena with [facility] [place] called in again to inquire about getting the reports of CT done 04/13/20 and MRI done 05/31/20 they have already received the medical records but these reports are not in there. Any questions please call Myriam Jacobson Ph# [phone] or fax reports to fax# [phone]

## 2020-08-20 ENCOUNTER — Ambulatory Visit: Admit: 2020-08-20 | Payer: PRIVATE HEALTH INSURANCE | Admitting: Gastroenterology

## 2020-08-20 SURGERY — COLONOSCOPY WITH PROPOFOL
Anesthesia: General

## 2020-09-06 ENCOUNTER — Telehealth: Payer: Self-pay

## 2020-09-06 NOTE — Telephone Encounter (Signed)
Patient PCP called in to verify that Traci received the demographic information that she requested? Provider does not accept insurance so she doesn't have the patients ins info.

## 2021-02-28 ENCOUNTER — Encounter: Payer: PRIVATE HEALTH INSURANCE | Admitting: Internal Medicine

## 2021-03-08 ENCOUNTER — Other Ambulatory Visit: Payer: Self-pay

## 2021-03-08 ENCOUNTER — Ambulatory Visit
Admission: RE | Admit: 2021-03-08 | Discharge: 2021-03-08 | Disposition: A | Discharge status: Home / Self Care | Payer: PRIVATE HEALTH INSURANCE | Attending: Family Medicine | Admitting: Family Medicine

## 2021-03-08 ENCOUNTER — Other Ambulatory Visit: Payer: Self-pay | Admitting: Family Medicine

## 2021-03-08 ENCOUNTER — Ambulatory Visit
Admission: RE | Admit: 2021-03-08 | Discharge: 2021-03-08 | Disposition: A | Discharge status: Home / Self Care | Payer: PRIVATE HEALTH INSURANCE | Source: Ambulatory Visit | Attending: Family Medicine | Admitting: Family Medicine

## 2021-03-08 DIAGNOSIS — R0602 Shortness of breath: Secondary | ICD-10-CM

## 2021-03-08 DIAGNOSIS — R059 Cough, unspecified: Secondary | ICD-10-CM | POA: Insufficient documentation

## 2021-04-03 ENCOUNTER — Ambulatory Visit (INDEPENDENT_AMBULATORY_CARE_PROVIDER_SITE_OTHER): Payer: PRIVATE HEALTH INSURANCE | Admitting: Pulmonary Disease

## 2021-04-03 ENCOUNTER — Encounter: Payer: Self-pay | Admitting: Pulmonary Disease

## 2021-04-03 ENCOUNTER — Other Ambulatory Visit: Payer: Self-pay

## 2021-04-03 ENCOUNTER — Other Ambulatory Visit
Admission: RE | Admit: 2021-04-03 | Discharge: 2021-04-03 | Disposition: A | Discharge status: Home / Self Care | Payer: PRIVATE HEALTH INSURANCE | Source: Ambulatory Visit | Attending: Pulmonary Disease | Admitting: Pulmonary Disease

## 2021-04-03 VITALS — BP 132/70 | HR 74 | Temp 97.5°F | Ht 62.0 in | Wt 180.0 lb

## 2021-04-03 DIAGNOSIS — R0602 Shortness of breath: Secondary | ICD-10-CM

## 2021-04-03 DIAGNOSIS — R601 Generalized edema: Secondary | ICD-10-CM | POA: Diagnosis not present

## 2021-04-03 DIAGNOSIS — I10 Essential (primary) hypertension: Secondary | ICD-10-CM

## 2021-04-03 DIAGNOSIS — R053 Chronic cough: Secondary | ICD-10-CM

## 2021-04-03 LAB — CBC WITH DIFFERENTIAL/PLATELET
Abs Immature Granulocytes: 0.04 10*3/uL (ref 0.00–0.07)
Basophils Absolute: 0.1 10*3/uL (ref 0.0–0.1)
Basophils Relative: 1 %
Eosinophils Absolute: 0.8 10*3/uL — ABNORMAL HIGH (ref 0.0–0.5)
Eosinophils Relative: 10 %
HCT: 41.7 % (ref 36.0–46.0)
Hemoglobin: 14.4 g/dL (ref 12.0–15.0)
Immature Granulocytes: 1 %
Lymphocytes Relative: 28 %
Lymphs Abs: 2.2 10*3/uL (ref 0.7–4.0)
MCH: 30.4 pg (ref 26.0–34.0)
MCHC: 34.5 g/dL (ref 30.0–36.0)
MCV: 88 fL (ref 80.0–100.0)
Monocytes Absolute: 0.7 10*3/uL (ref 0.1–1.0)
Monocytes Relative: 9 %
Neutro Abs: 4.1 10*3/uL (ref 1.7–7.7)
Neutrophils Relative %: 51 %
Platelets: 353 10*3/uL (ref 150–400)
RBC: 4.74 MIL/uL (ref 3.87–5.11)
RDW: 12.6 % (ref 11.5–15.5)
WBC: 7.9 10*3/uL (ref 4.0–10.5)
nRBC: 0 % (ref 0.0–0.2)

## 2021-04-03 MED ORDER — LOSARTAN POTASSIUM 25 MG PO TABS: 25.0000 mg | ORAL_TABLET | Freq: Every day | ORAL | 2 refills | Status: AC

## 2021-04-03 NOTE — Patient Instructions (Addendum)
We are going to get breathing test to evaluate your shortness of breath and cough.  I have made some changes on your blood pressure medicine after examining you today.  I will let your primary care provider know.  Refills on the blood pressure medication will be through your primary care provider.  You may still use the fluid pill on days when you are really swollen, you can use it as needed.  We will see him in follow-up in 2 months time call sooner should any new problems arise.

## 2021-04-03 NOTE — Progress Notes (Signed)
Subjective:    Patient ID: Emily Pena, female    DOB: 1950/12/06, 70 y.o.   MRN: [id number] Chief Complaint  Patient presents with   pulmonary consult    Per Juanda Bond, NP-- non prod cough x9mo and mild sob with exertion.     HPI Patient is a 70 year old lifelong never smoker who presents for the issue of cough present for several years however worsened over the last 6 weeks.  She is kindly referred by Juanda Bond, NP.  The patient also notes some dyspnea on exertion again has been chronic but worsened over the last 6 weeks.  She notes that her cough worsens when laying down.  She notes that recently she was treated with an Azithromycin "Z-Pak" and this improved it somewhat.  For several years she has noted that every time she gets a "cold" it always goes to her chest.  Then she coughs for approximately a month after that.  This time around she does not recall any URI precipitating the cough.  She notes that she does wake up coughing at times.  She has not had any hemoptysis.  She has tenacious secretions get difficult for her to expectorate.  Has not noticed any sputum discoloration.  She notes that the cough can happen anytime throughout the day or night but as noted above it does worsen when she lays down.  She has not had any chest pain, no orthopnea no paroxysmal nocturnal dyspnea.  Times does have to sit up in bed if she has started coughing while supine.  No GERD symptoms.  She has noted significant lower extremity edema since her blood pressure medication was switched to amlodipine and ACE inhibitor.  He also has noted a rash develop after starting on amlodipine.  She is a lifelong never smoker as noted.  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  No significant occupational exposure except of that to fabrics.   Review of Systems A 10 point review of systems was performed and it is as noted above otherwise negative.  Past Medical  History:  Diagnosis Date   Arthritis    Joints arthritis.   Depression    History of kidney stones    Hyperlipidemia    Hypertension    Kidney stones    Osteoarthritis    Vitiligo    Past Surgical History:  Procedure Laterality Date   CESAREAN SECTION  1985 &1991   COLONOSCOPY  04/26/2007   [facility]- removed polyp   NM MYOCAR PERF WALL MOTION  07/06/2007   no significant ischemia present   US ECHOCARDIOGRAPHY  04/20/2007   normal   vocal nodules  1978   Family History  Problem Relation Age of Onset   Hypertension Mother    Heart failure Father    Stroke Sister    Cancer Brother    Aneurysm Maternal Grandfather    Heart attack Paternal Grandfather    Social History   Tobacco Use   Smoking status: Never   Smokeless tobacco: Never  Substance Use Topics   Alcohol use: Yes    Alcohol/week: 1.0 - 2.0 standard drink    Types: 1 - 2 Standard drinks or equivalent per week   Allergies  Allergen Reactions   Statins     Muscle pains    Meperidine Nausea And Vomiting   Propoxyphene Nausea And Vomiting   Current Meds  Medication Sig   Albuterol Sulfate 2.5 MG/0.5ML NEBU Inhale 2.5 mg  into the lungs every 6 (six) hours as needed.   amLODipine (NORVASC) 5 MG tablet Take 5 mg by mouth daily.   benzonatate (TESSALON) 100 MG capsule Take by mouth 3 (three) times daily as needed for cough.   escitalopram (LEXAPRO) 10 MG tablet Take 10 mg by mouth daily.   estrogens, conjugated, (PREMARIN) 0.625 MG tablet Take 0.625 mg by mouth daily. Take daily for 21 days then do not take for 7 days.   losartan (COZAAR) 25 MG tablet Take 1 tablet (25 mg total) by mouth daily.   montelukast (SINGULAIR) 10 MG tablet Take 10 mg by mouth at bedtime.   nystatin cream (MYCOSTATIN) Apply 1 application topically 2 (two) times daily.   [DISCONTINUED] amLODIPine Besylate-Celecoxib 5-200 MG TABS Take 1 tablet by mouth daily.   [DISCONTINUED] hydrochlorothiazide (HYDRODIURIL) 12.5 MG tablet Take 12.5 mg  by mouth daily.   [DISCONTINUED] lisinopril (ZESTRIL) 30 MG tablet Take 30 mg by mouth daily.   Immunization History  Administered Date(s) Administered   Pneumococcal Conjugate-13 02/22/2019   Pneumococcal Polysaccharide-23 12/07/2017      Objective:   Physical Exam BP 132/70 (BP Location: Left Arm, Cuff Size: Normal)   Pulse 74   Temp (!) 97.5 F (36.4 C) (Temporal)   Ht 5\' 2"  (1.575 m)   Wt 180 lb (81.6 kg)   SpO2 96%   BMI 32.92 kg/m  GENERAL: Overweight woman, no acute distress, fully ambulatory.  Occasionally clearing throat, occasional dry cough.  Awake and alert. HEAD: Normocephalic, atraumatic.  EYES: Pupils equal, round, reactive to light.  No scleral icterus.  MOUTH: Nose/mouth/throat not examined due to masking requirements for COVID 19. NECK: Supple. No thyromegaly. Trachea midline. No JVD.  No adenopathy. PULMONARY: Good air entry bilaterally.  No adventitious sounds. CARDIOVASCULAR: S1 and S2. Regular rate and rhythm.  No rubs, murmurs or gallops heard. ABDOMEN: Obese otherwise benign. MUSCULOSKELETAL: No joint deformity, no clubbing, there is 2+ to 3+ pitting edema of the lower extremities. NEUROLOGIC: No focal deficit, no gait disturbance, speech is fluent. SKIN: Intact,warm,dry.  She has a erythematous rash in the inner aspect of her right lower extremity, query erythema multiforme.  Various areas of vitiligo. PSYCH: Mood and behavior normal  Chest x-ray of 03/08/2021, normal:       Assessment & Plan:     ICD-10-CM   1. Shortness of breath  R06.02 Pulmonary Function Test [facility] Only    CBC w/Diff    Allergen Panel (27) + IGE   Will obtain pulmonary function testing to clarify     2. Chronic cough  R05.3    PFTs should help evaluate. Agree with discontinuing ACE inhibitor    3. Essential hypertension  I10    Start losartan 25 mg daily Advised the patient to take HCTZ only on days with increased swelling Discontinue amlodipine due to edema issues     4. Generalized edema  R60.1    Discontinue amlodipine Diuretic according to weight/edema     Orders Placed This Encounter  Procedures   CBC w/Diff    Standing Status:   Future    Number of Occurrences:   1    Standing Expiration Date:   04/03/2022   Allergen Panel (27) + IGE    Standing Status:   Future    Number of Occurrences:   1    Standing Expiration Date:   04/03/2022   Pulmonary Function Test [facility] Only    Standing Status:   Future  Standing Expiration Date:   04/03/2022    Order Specific Question:   Full PFT: includes the following: basic spirometry, spirometry pre & post bronchodilator, diffusion capacity (DLCO), lung volumes    Answer:   Full PFT   Meds ordered this encounter  Medications   losartan (COZAAR) 25 MG tablet    Sig: Take 1 tablet (25 mg total) by mouth daily.    Dispense:  30 tablet    Refill:  2    Discontinue amlodipine   Patient cough has been present for several years but worse over the last 6 months.  She states that she gets bronchitis frequently usually triggered by upper respiratory infection.  This appears to be cyclical events of post infectious cough.  Suspect element of asthmatic bronchitis.  For now we will continue as needed albuterol.  Will obtain PFTs and allergen panel as noted above.  No other medication adjustments until PFTs obtained.  Will discontinue amlodipine due to significant issues with edema and possible erythema multiforme.  Switch to Cozaar.  Advised on daily weights and assessment of edema and then take HCTZ as needed during those days.  We will see her in follow-up in 2 months time she is to contact us prior to that time should any new problems arise.  Renold Don, MD [facility]    *This note was dictated using voice recognition software/Dragon.  Despite best efforts to proofread, errors can occur which can change the meaning.  Any change was purely unintentional.

## 2021-04-04 ENCOUNTER — Encounter: Payer: Self-pay | Admitting: Pulmonary Disease

## 2021-04-04 ENCOUNTER — Other Ambulatory Visit
Admission: RE | Admit: 2021-04-04 | Discharge: 2021-04-04 | Disposition: A | Discharge status: Home / Self Care | Payer: PRIVATE HEALTH INSURANCE | Source: Ambulatory Visit | Attending: Pulmonary Disease | Admitting: Pulmonary Disease

## 2021-04-04 DIAGNOSIS — Z01812 Encounter for preprocedural laboratory examination: Secondary | ICD-10-CM | POA: Insufficient documentation

## 2021-04-04 DIAGNOSIS — Z20822 Contact with and (suspected) exposure to covid-19: Secondary | ICD-10-CM | POA: Diagnosis not present

## 2021-04-04 LAB — SARS CORONAVIRUS 2 (TAT 6-24 HRS): SARS Coronavirus 2: NEGATIVE

## 2021-04-05 ENCOUNTER — Ambulatory Visit: Payer: PRIVATE HEALTH INSURANCE | Attending: Pulmonary Disease

## 2021-04-05 DIAGNOSIS — R0602 Shortness of breath: Secondary | ICD-10-CM | POA: Diagnosis present

## 2021-04-05 MED ORDER — ALBUTEROL SULFATE (2.5 MG/3ML) 0.083% IN NEBU
2.5000 mg | INHALATION_SOLUTION | Freq: Once | RESPIRATORY_TRACT | Status: AC
  Administered 2021-04-05: 2.5 mg via RESPIRATORY_TRACT
  Filled 2021-04-05: qty 3

## 2021-04-06 LAB — ALLERGEN PANEL (27) + IGE
Alternaria Alternata IgE: <0.1 kU/L
Aspergillus Fumigatus IgE: <0.1 kU/L
Bahia Grass IgE: 0.52 kU/L — AB
Bermuda Grass IgE: <0.1 kU/L
Cat Dander IgE: <0.1 kU/L
Cedar, Mountain IgE: <0.1 kU/L
Cladosporium Herbarum IgE: <0.1 kU/L
Cocklebur IgE: <0.1 kU/L
Cockroach, American IgE: <0.1 kU/L
Common Silver Birch IgE: <0.1 kU/L
D Farinae IgE: <0.1 kU/L
D Pteronyssinus IgE: <0.1 kU/L
Dog Dander IgE: <0.1 kU/L
Elm, American IgE: <0.1 kU/L
Hickory, White IgE: <0.1 kU/L
IgE (Immunoglobulin E), Serum: 23 IU/mL (ref 6–495)
Johnson Grass IgE: 0.24 kU/L — AB
Kentucky Bluegrass IgE: 1.41 kU/L — AB
Maple/Box Elder IgE: <0.1 kU/L
Mucor Racemosus IgE: <0.1 kU/L
Oak, White IgE: <0.1 kU/L
Penicillium Chrysogen IgE: <0.1 kU/L
Pigweed, Rough IgE: <0.1 kU/L
Plantain, English IgE: <0.1 kU/L
Ragweed, Short IgE: <0.1 kU/L
Setomelanomma Rostrat: <0.1 kU/L
Timothy Grass IgE: 1.38 kU/L — AB
White Mulberry IgE: <0.1 kU/L

## 2021-05-13 ENCOUNTER — Other Ambulatory Visit: Payer: Self-pay | Admitting: Family Medicine

## 2021-05-13 DIAGNOSIS — I10 Essential (primary) hypertension: Secondary | ICD-10-CM

## 2021-05-22 ENCOUNTER — Ambulatory Visit
Admission: RE | Admit: 2021-05-22 | Discharge: 2021-05-22 | Disposition: A | Discharge status: Home / Self Care | Payer: PRIVATE HEALTH INSURANCE | Source: Ambulatory Visit | Attending: Family Medicine | Admitting: Family Medicine

## 2021-05-22 DIAGNOSIS — I10 Essential (primary) hypertension: Secondary | ICD-10-CM | POA: Diagnosis present

## 2021-06-04 ENCOUNTER — Ambulatory Visit: Payer: PRIVATE HEALTH INSURANCE | Admitting: Pulmonary Disease

## 2021-06-20 ENCOUNTER — Other Ambulatory Visit: Payer: Self-pay

## 2021-06-20 DIAGNOSIS — Z1211 Encounter for screening for malignant neoplasm of colon: Secondary | ICD-10-CM

## 2021-06-20 MED ORDER — NA SULFATE-K SULFATE-MG SULF 17.5-3.13-1.6 GM/177ML PO SOLN: 1.0000 | Freq: Once | ORAL | 0 refills | Status: AC

## 2021-06-20 NOTE — Progress Notes (Signed)
Gastroenterology Pre-Procedure Review  Request Date: 07/15/2021 Requesting Physician: Dr. Servando Snare  PATIENT REVIEW QUESTIONS: The patient responded to the following health history questions as indicated:    1. Are you having any GI issues?  Occasionally diarrhea 2. Do you have a personal history of Polyps?  unsure 3. Do you have a family history of Colon Cancer or Polyps? yes (Parental uncle) 4. Diabetes Mellitus? no 5. Joint replacements in the past 12 months?no 6. Major health problems in the past 3 months?no 7. Any artificial heart valves, MVP, or defibrillator?no    MEDICATIONS & ALLERGIES:    Patient reports the following regarding taking any anticoagulation/antiplatelet therapy:   Plavix, Coumadin, Eliquis, Xarelto, Lovenox, Pradaxa, Brilinta, or Effient? no Aspirin? no  Patient confirms/reports the following medications:  Current Outpatient Medications  Medication Sig Dispense Refill   Albuterol Sulfate 2.5 MG/0.5ML NEBU Inhale 2.5 mg into the lungs every 6 (six) hours as needed.     amLODipine (NORVASC) 5 MG tablet Take 5 mg by mouth daily.     benzonatate (TESSALON) 100 MG capsule Take by mouth 3 (three) times daily as needed for cough.     escitalopram (LEXAPRO) 10 MG tablet Take 10 mg by mouth daily.     estrogens, conjugated, (PREMARIN) 0.625 MG tablet Take 0.625 mg by mouth daily. Take daily for 21 days then do not take for 7 days.     losartan (COZAAR) 25 MG tablet Take 1 tablet (25 mg total) by mouth daily. 30 tablet 2   montelukast (SINGULAIR) 10 MG tablet Take 10 mg by mouth at bedtime.     nystatin cream (MYCOSTATIN) Apply 1 application topically 2 (two) times daily.     No current facility-administered medications for this visit.    Patient confirms/reports the following allergies:  Allergies  Allergen Reactions   Statins     Muscle pains    Meperidine Nausea And Vomiting   Propoxyphene Nausea And Vomiting    No orders of the defined types were placed in  this encounter.   AUTHORIZATION INFORMATION Primary Insurance: 1D#: Group #:  Secondary Insurance: 1D#: Group #:  SCHEDULE INFORMATION: Date: 07/15/2021 Time: Location: [facility]

## 2021-07-09 ENCOUNTER — Encounter: Payer: Self-pay | Admitting: Gastroenterology

## 2021-07-09 ENCOUNTER — Telehealth: Payer: Self-pay | Admitting: Gastroenterology

## 2021-07-09 ENCOUNTER — Encounter: Payer: Self-pay | Admitting: Anesthesiology

## 2021-07-09 NOTE — Telephone Encounter (Signed)
I called [facility] and they stated that the Rx is on file and they will get it ready for the pt to pick up ?

## 2021-07-09 NOTE — Telephone Encounter (Signed)
Patient requesting prescription be resent to [facility] as she did not pick it up. Said she could pick up Friday. ?

## 2021-07-15 ENCOUNTER — Ambulatory Visit
Admission: RE | Admit: 2021-07-15 | Payer: PRIVATE HEALTH INSURANCE | Source: Home / Self Care | Admitting: Gastroenterology

## 2021-07-15 HISTORY — DX: Presence of spectacles and contact lenses: Z97.3

## 2021-07-15 HISTORY — DX: Family history of other specified conditions: Z84.89

## 2021-07-15 SURGERY — COLONOSCOPY WITH PROPOFOL
Anesthesia: Choice

## 2021-10-18 ENCOUNTER — Other Ambulatory Visit: Payer: Self-pay | Admitting: Family Medicine

## 2021-10-18 DIAGNOSIS — Z1231 Encounter for screening mammogram for malignant neoplasm of breast: Secondary | ICD-10-CM

## 2021-10-18 DIAGNOSIS — R221 Localized swelling, mass and lump, neck: Secondary | ICD-10-CM

## 2022-04-29 ENCOUNTER — Encounter: Payer: Self-pay | Admitting: Emergency Medicine

## 2022-04-29 ENCOUNTER — Ambulatory Visit
Admission: EM | Admit: 2022-04-29 | Discharge: 2022-04-29 | Disposition: A | Discharge status: Home / Self Care | Payer: PRIVATE HEALTH INSURANCE | Attending: Physician Assistant | Admitting: Physician Assistant

## 2022-04-29 ENCOUNTER — Ambulatory Visit (INDEPENDENT_AMBULATORY_CARE_PROVIDER_SITE_OTHER): Payer: PRIVATE HEALTH INSURANCE

## 2022-04-29 DIAGNOSIS — M549 Dorsalgia, unspecified: Secondary | ICD-10-CM

## 2022-04-29 DIAGNOSIS — M25551 Pain in right hip: Secondary | ICD-10-CM

## 2022-04-29 DIAGNOSIS — W19XXXA Unspecified fall, initial encounter: Secondary | ICD-10-CM

## 2022-04-29 MED ORDER — BACLOFEN 10 MG PO TABS: 10.0000 mg | ORAL_TABLET | Freq: Three times a day (TID) | ORAL | 0 refills | Status: DC | PRN

## 2022-04-29 MED ORDER — BACLOFEN 10 MG PO TABS: 10.0000 mg | ORAL_TABLET | Freq: Three times a day (TID) | ORAL | 0 refills | Status: AC | PRN

## 2022-04-29 NOTE — ED Triage Notes (Signed)
Pt was taking down christmas decorations in the house and was on a ladder and fell. She landed on her right side butt/hip area and she does report hitting her head but did not lose consciousness.

## 2022-04-29 NOTE — Discharge Instructions (Addendum)
-  No fractures on your x-ray. -Tylenol (1000 mg every 8 hr as needed) -Ibuprofen (400--600 mg every 6 hr as needed)  BACK PAIN: Stressed avoiding painful activities . RICE (REST, ICE, COMPRESSION, ELEVATION) guidelines reviewed. May alternate ice and heat. Consider use of muscle rubs, Salonpas patches, etc. Use medications as directed including muscle relaxers if prescribed. Take anti-inflammatory medications as prescribed or OTC NSAIDs/Tylenol.  F/u with PCP in 7-10 days for reexamination, and please feel free to call or return to the urgent care at any time for any questions or concerns you may have and we will be happy to help you!   BACK PAIN RED FLAGS: If the back pain acutely worsens or there are any red flag symptoms such as numbness/tingling, leg weakness, saddle anesthesia, or loss of bowel/bladder control, go immediately to the ER. Follow up with [place] as scheduled or sooner if the pain does not begin to resolve or if it worsens before the follow up    -If you start to have any severe headaches, vision changes, vomiting, feel confused or weak, have numbness or weakness in your legs, pain on breathing or chest pain, shortness of breath you need to go to the ER.

## 2022-04-29 NOTE — ED Provider Notes (Signed)
[facility]    CSN: [id number] Arrival date & time: 04/29/22  1641      History   Chief Complaint Chief Complaint  Patient presents with   Fall    HPI Emily Pena is a 72 y.o. female presenting for right hip pain and right lower back pain.  She reports that she was taking down Christmas lights earlier and was about 3-4 steps up on a ladder when she realized it was unsteady and she fell onto her right side.  She says her hip and back took the brunt of the fall.  She reports she fell onto her right shoulder as well but she has full range of motion and no real pain in her shoulder or arm.  She denies any pain in her ribs or breathing difficulty or pain on breathing.  No chest pain.  Reports that she hit her head mildly but it mostly hit her here clip and broke the clip.  She denies loss of consciousness, confusion, dizziness, nausea/vomiting, numbness/tingling or weakness.  No vision changes.  She says her pain is not that bad.  She does report taking 600 mg of ibuprofen a few hours ago and says that it helps.  She says she is not really worried that her husband wanted her to come and get checked out today.  Her medical history is significant for arthritis of multiple joints, hypertension, hyperlipidemia.  No other injuries or complaints.  HPI  Past Medical History:  Diagnosis Date   Arthritis    Joints arthritis.   Depression    Family history of adverse reaction to anesthesia    sister - slow to wake   History of kidney stones    Hyperlipidemia    Hypertension    Kidney stones    Osteoarthritis    Vitiligo    Wears contact lenses     Patient Active Problem List   Diagnosis Date Noted   Aortic atherosclerosis ([place]) 05/31/2020   Hepatic steatosis 05/31/2020   Liver mass 04/19/2020   Hypokalemia 04/19/2020   Hyperlipidemia, mixed 02/24/2019   Primary osteoarthritis 12/07/2017   Current moderate episode of major depressive disorder without prior episode ([place])  06/02/2017   History of kidney stones 06/02/2017   Vitiligo 42/59/5638   Lichen planus 75/64/3329   Environmental and seasonal allergies 06/02/2017   Essential hypertension 05/09/2013    Past Surgical History:  Procedure Laterality Date   CESAREAN SECTION  1985 &1991   COLONOSCOPY  04/26/2007   [facility]- removed polyp   NM MYOCAR PERF WALL MOTION  07/06/2007   no significant ischemia present   US ECHOCARDIOGRAPHY  04/20/2007   normal   vocal nodules  1978    OB History   No obstetric history on file.      Home Medications    Prior to Admission medications   Medication Sig Start Date End Date Taking? Authorizing Provider  Albuterol Sulfate 2.5 MG/0.5ML NEBU Inhale 2.5 mg into the lungs every 6 (six) hours as needed. Patient not taking: Reported on 07/09/2021    [provider]  baclofen (LIORESAL) 10 MG tablet Take 1 tablet (10 mg total) by mouth 3 (three) times daily as needed for muscle spasms. 04/29/22   Laurene Footman B, PA-C  Cyanocobalamin (VITAMIN B-12 PO) Take by mouth.    [provider]  escitalopram (LEXAPRO) 10 MG tablet Take 10 mg by mouth daily.    [provider]  estrogens, conjugated, (PREMARIN) 0.625 MG tablet  Take 0.625 mg by mouth daily. Take daily for 21 days then do not take for 7 days.    [provider]  losartan (COZAAR) 25 MG tablet Take 1 tablet (25 mg total) by mouth daily. Patient taking differently: Take 25 mg by mouth daily. With 50 mg for total of 75 mg 04/03/21 07/09/21  Tyler Pita, MD  losartan (COZAAR) 50 MG tablet Take 50 mg by mouth daily. With 25 mg for total of 75 mg    [provider]  montelukast (SINGULAIR) 10 MG tablet Take 10 mg by mouth at bedtime. Patient not taking: Reported on 07/09/2021    [provider]  Multiple Vitamin (MULTIVITAMIN WITH MINERALS) TABS tablet Take 1 tablet by mouth daily.    [provider]  Multiple Vitamins-Minerals (ZINC PO) Take by mouth.     [provider]  nystatin cream (MYCOSTATIN) Apply 1 application topically 2 (two) times daily.    [provider]  VITAMIN D PO Take by mouth.    [provider]    Family History Family History  Problem Relation Age of Onset   Hypertension Mother    Heart failure Father    Stroke Sister    Cancer Brother    Aneurysm Maternal Grandfather    Heart attack Paternal Grandfather     Social History Social History   Tobacco Use   Smoking status: Never   Smokeless tobacco: Never  Vaping Use   Vaping Use: Never used  Substance Use Topics   Alcohol use: Yes    Alcohol/week: 1.0 - 2.0 standard drink of alcohol    Types: 1 - 2 Standard drinks or equivalent per week   Drug use: No     Allergies   Statins, Meperidine, and Propoxyphene   Review of Systems Review of Systems  Constitutional:  Negative for fatigue.  Eyes:  Negative for visual disturbance.  Respiratory:  Negative for cough and shortness of breath.   Cardiovascular:  Negative for chest pain.  Gastrointestinal:  Negative for abdominal pain, nausea and vomiting.  Musculoskeletal:  Positive for arthralgias and back pain. Negative for gait problem, joint swelling and neck pain.  Skin:  Negative for color change and wound.  Neurological:  Negative for dizziness, syncope, weakness, numbness and headaches.     Physical Exam Triage Vital Signs ED Triage Vitals  Enc Vitals Group     BP 04/29/22 1810 (!) 137/59     Pulse Rate 04/29/22 1810 79     Resp 04/29/22 1810 16     Temp 04/29/22 1810 97.9 F (36.6 C)     Temp Source 04/29/22 1810 Oral     SpO2 04/29/22 1810 95 %     Weight --      Height --      Head Circumference --      Peak Flow --      Pain Score 04/29/22 1808 5     Pain Loc --      Pain Edu? --      Excl. in [place]? --    No data found.  Updated Vital Signs BP (!) 137/59 (BP Location: Left Arm)   Pulse 79   Temp 97.9 F (36.6 C) (Oral)   Resp 16   SpO2 95%      Physical Exam Vitals and nursing note reviewed.  Constitutional:      General: She is not in acute distress.    Appearance: Normal appearance. She is not ill-appearing  or toxic-appearing.  HENT:     Head: Normocephalic and atraumatic.     Right Ear: Tympanic membrane, ear canal and external ear normal.     Left Ear: Tympanic membrane, ear canal and external ear normal.     Nose: Nose normal.     Mouth/Throat:     Mouth: Mucous membranes are moist.     Pharynx: Oropharynx is clear.  Eyes:     General: No scleral icterus.       Right eye: No discharge.        Left eye: No discharge.     Conjunctiva/sclera: Conjunctivae normal.  Cardiovascular:     Rate and Rhythm: Normal rate and regular rhythm.     Heart sounds: Normal heart sounds.  Pulmonary:     Effort: Pulmonary effort is normal. No respiratory distress.     Breath sounds: Normal breath sounds.  Musculoskeletal:     Cervical back: Neck supple.     Comments: Back: There is no spinal tenderness.  She has some tenderness palpation of the right paralumbar muscles.  Full range of motion of back seemingly without pain.  Right hip: There is some tenderness to palpation of the right posterior and lateral hip mildly but she has full range of motion of the hip.  Normal gait.  Skin:    General: Skin is dry.  Neurological:     General: No focal deficit present.     Mental Status: She is alert and oriented to person, place, and time. Mental status is at baseline.     Cranial Nerves: No cranial nerve deficit.     Motor: No weakness.     Coordination: Coordination normal.     Gait: Gait normal.     Comments: 5 out of 5 strength bilateral upper and lower extremities.  Normal nose to finger testing.  Normal rapid alternating supination pronation of hands.  Psychiatric:        Mood and Affect: Mood normal.        Behavior: Behavior normal.        Thought Content: Thought content normal.      UC Treatments / Results  Labs (all  labs ordered are listed, but only abnormal results are displayed) Labs Reviewed - No data to display  EKG   Radiology DG Hip Unilat W or Wo Pelvis 2-3 Views Right  Result Date: 04/29/2022 CLINICAL DATA:  Right hip pain, fall EXAM: DG HIP (WITH  PELVIS) 3V RIGHT COMPARISON:  None Available. FINDINGS: There is no evidence of hip fracture or dislocation. There is no evidence of arthropathy or other focal bone abnormality. IMPRESSION: Negative. Electronically Signed   By: Wiliam Ke M.D.   On: 04/29/2022 18:45    Procedures Procedures (including critical care time)  Medications Ordered in UC Medications - No data to display  Initial Impression / Assessment and Plan / UC Course  I have reviewed the triage vital signs and the nursing notes.  Pertinent labs & imaging results that were available during my care of the patient were reviewed by me and considered in my medical decision making (see chart for details).  Clinical Course as of 04/29/22 1956  Tue Apr 29, 2022  1902 DG Hip Lucienne Capers or Wo Pelvis 2-3 Views Right [LE]    Clinical Course User Index [LE] Shirlee Latch, [place]   72 year old female presents for fall from a ladder and subsequent right-sided hip and lower back pain.  This occurred couple hours ago.  She also reports hitting her head but denies any swelling of the head, bruising, bleeding, loss consciousness, red flag signs or symptoms relating to head injury.  No visible injuries either.  Most bothered by her right-sided hip pain but she says its mild.  Has taken ibuprofen.  Vitals are stable.  She is overall well-appearing and in no acute distress.  No visible signs of injury.  Head is normocephalic and atraumatic.  Normal cranial nerve exam.  Chest clear auscultation heart regular rate and rhythm.  Tenderness palpation of the right paralumbar muscles.  No spinal tenderness.  Forage motion of back.  Tenderness palpation of the right posterior and lateral hip but full range of  motion of hip and normal gait.  X-ray of hip/pelvis today is normal.  Discussed result with patient.  Advised patient her injuries seem to be mild and soft tissue related.  Advised that she can continue ibuprofen and Tylenol as well as use heat, ice, muscle rubs.  She has reassuring exam overall.  Baclofen as needed for muscle aches and pains.  Explained to her that she will probably feel little worse tomorrow.  Advised her of return and ER precautions.  Final Clinical Impressions(s) / UC Diagnoses   Final diagnoses:  Right hip pain  Acute right-sided back pain, unspecified back location  Fall, initial encounter     Discharge Instructions      -No fractures on your x-ray. -Tylenol (1000 mg every 8 hr as needed) -Ibuprofen (400--600 mg every 6 hr as needed)  BACK PAIN: Stressed avoiding painful activities . RICE (REST, ICE, COMPRESSION, ELEVATION) guidelines reviewed. May alternate ice and heat. Consider use of muscle rubs, Salonpas patches, etc. Use medications as directed including muscle relaxers if prescribed. Take anti-inflammatory medications as prescribed or OTC NSAIDs/Tylenol.  F/u with PCP in 7-10 days for reexamination, and please feel free to call or return to the urgent care at any time for any questions or concerns you may have and we will be happy to help you!   BACK PAIN RED FLAGS: If the back pain acutely worsens or there are any red flag symptoms such as numbness/tingling, leg weakness, saddle anesthesia, or loss of bowel/bladder control, go immediately to the ER. Follow up with [place] as scheduled or sooner if the pain does not begin to resolve or if it worsens before the follow up    -If you start to have any severe headaches, vision changes, vomiting, feel confused or weak, have numbness or weakness in your legs, pain on breathing or chest pain, shortness of breath you need to go to the ER.     ED Prescriptions     Medication Sig Dispense Auth. Provider   baclofen  (LIORESAL) 10 MG tablet  (Status: Discontinued) Take 1 tablet (10 mg total) by mouth 3 (three) times daily as needed for muscle spasms. 30 each Shirlee Latch, PA-C   baclofen (LIORESAL) 10 MG tablet Take 1 tablet (10 mg total) by mouth 3 (three) times daily as needed for muscle spasms. 30 each Gareth Morgan      PDMP not reviewed this encounter.   Shirlee Latch, PA-C 04/29/22 2001

## 2022-08-24 IMAGING — CR DG CHEST 2V
2 series · 2 of 2 positions shown · non-contrast
Comparison: CT chest 07/06/2017.

CLINICAL DATA: Cough with post nasal drip and shortness of breath
for 3 weeks.

EXAM:
CHEST - 2 VIEW

[chest pa]
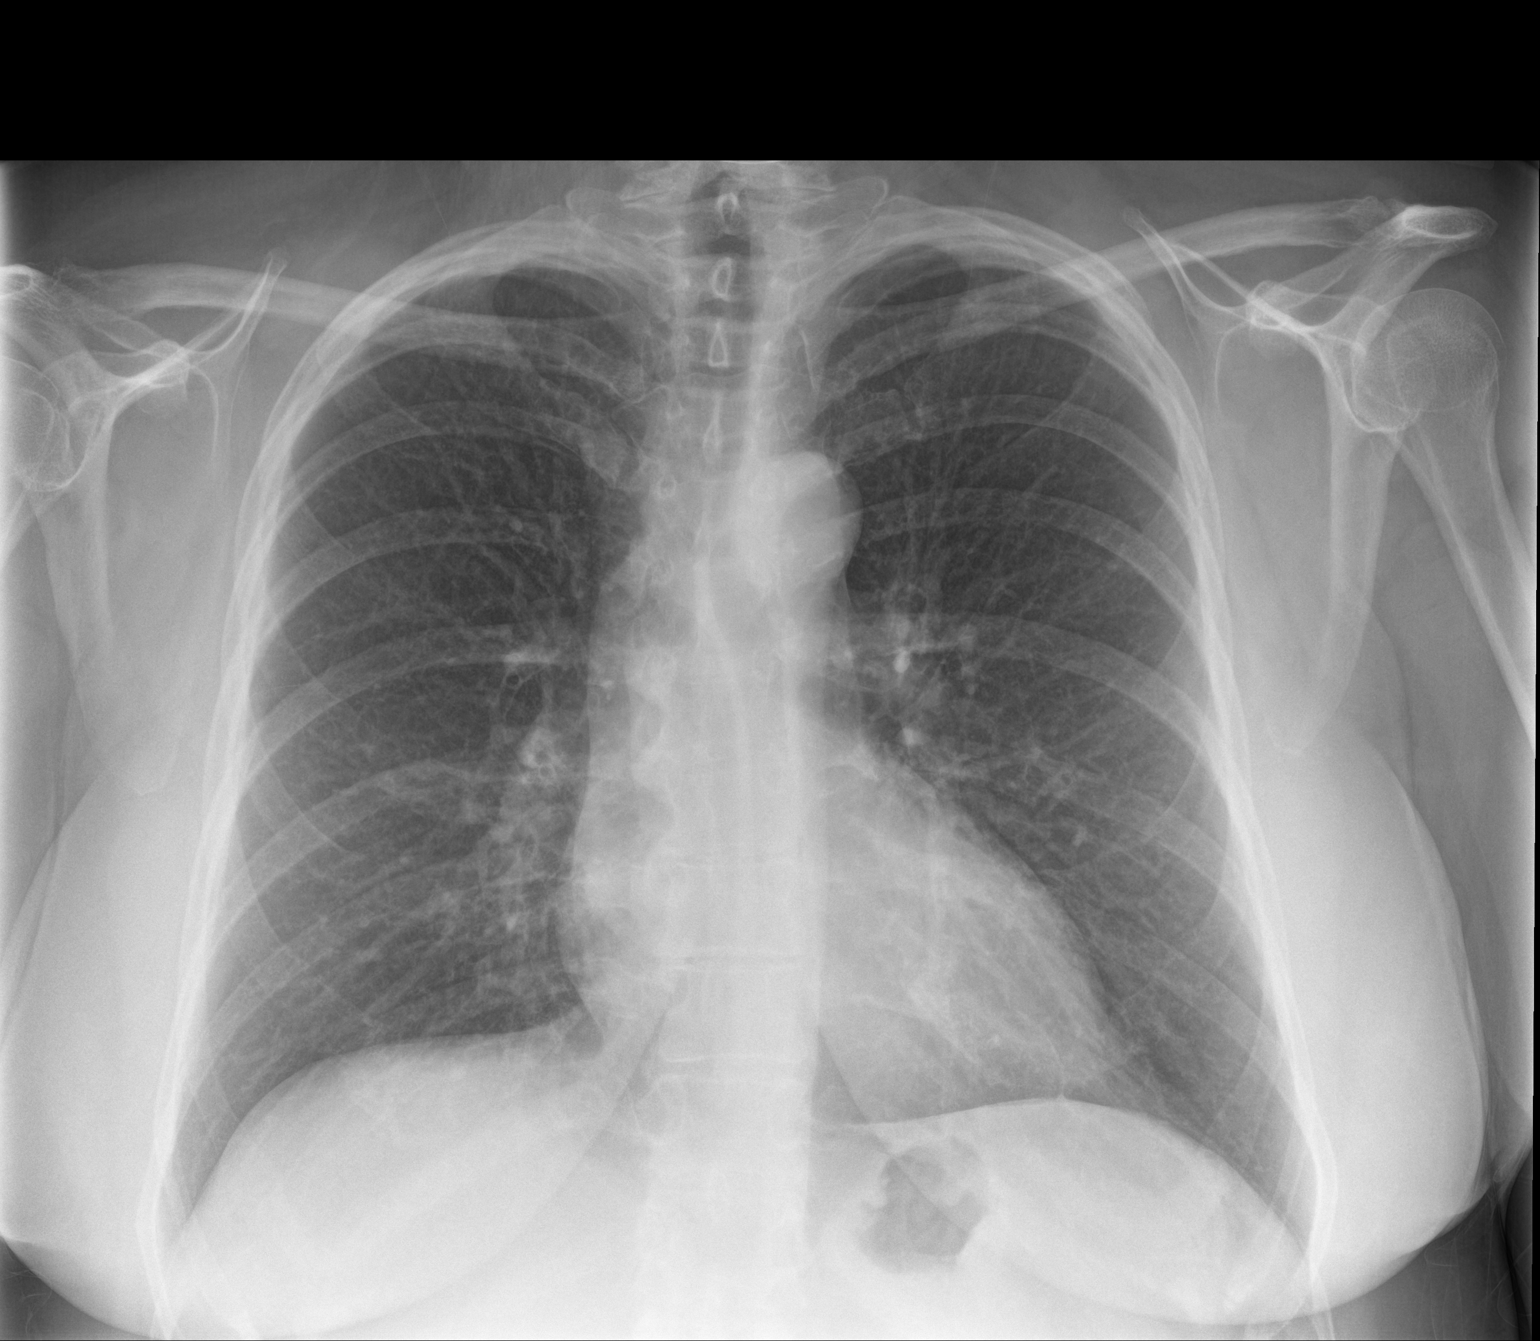

[chest lat]
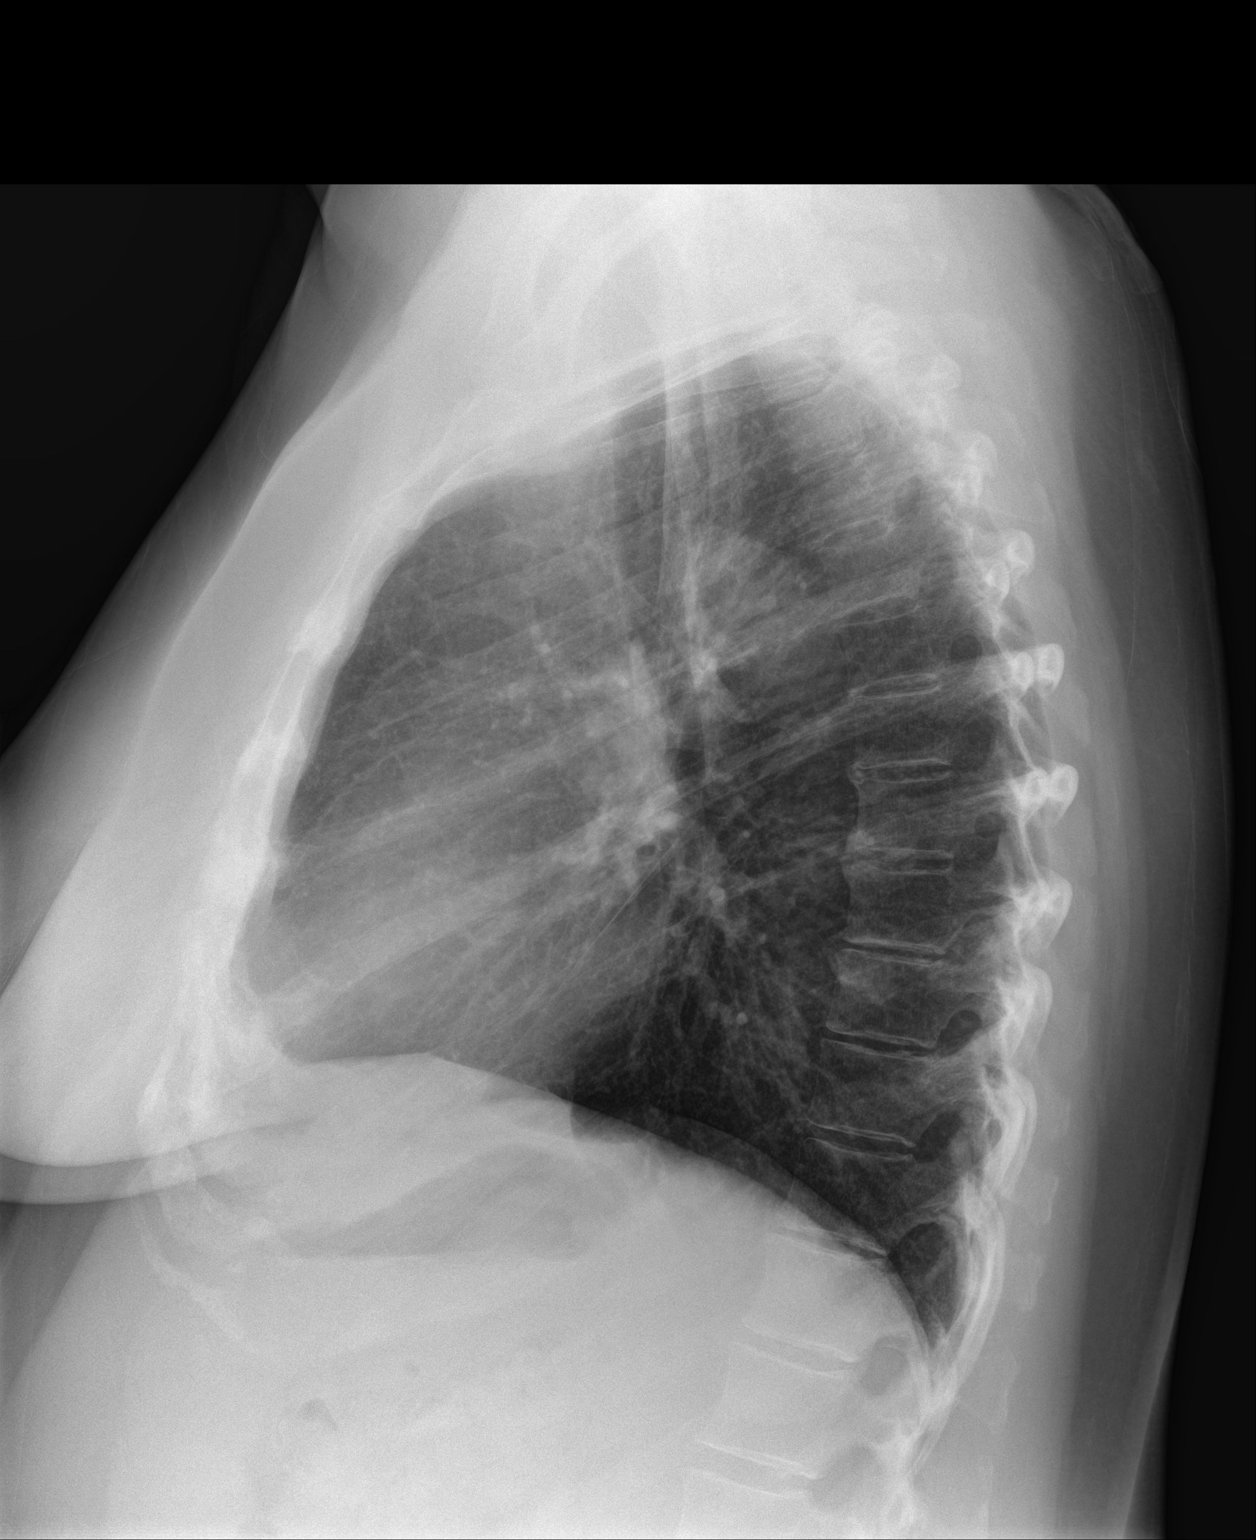

[2 of 2 positions shown; findings below may reference images not displayed]

FINDINGS: The heart size and mediastinal contours are within normal limits.
Both lungs are clear. The visualized skeletal structures are
unremarkable.
IMPRESSION: No active cardiopulmonary disease.

## 2022-11-07 IMAGING — US US RENAL ARTERY STENOSIS
1 series · 14 of 25 positions shown · non-contrast
Comparison: MRI abdomen from 05/30/2020

CLINICAL DATA: 70-year-old female with history of hypertension.

EXAM:
RENAL/URINARY TRACT ULTRASOUND
RENAL DUPLEX DOPPLER ULTRASOUND

[Series 1: us renal artery duplex complete · 14 of 69 slices shown]
[im 1/69]
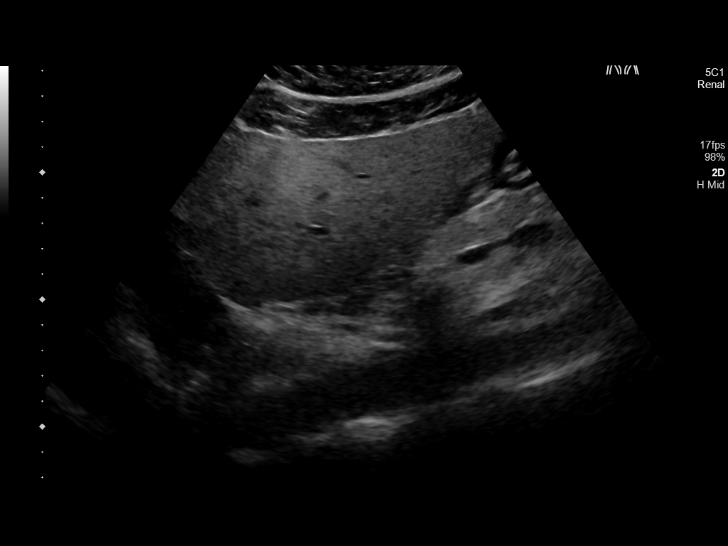
[im 6/69]
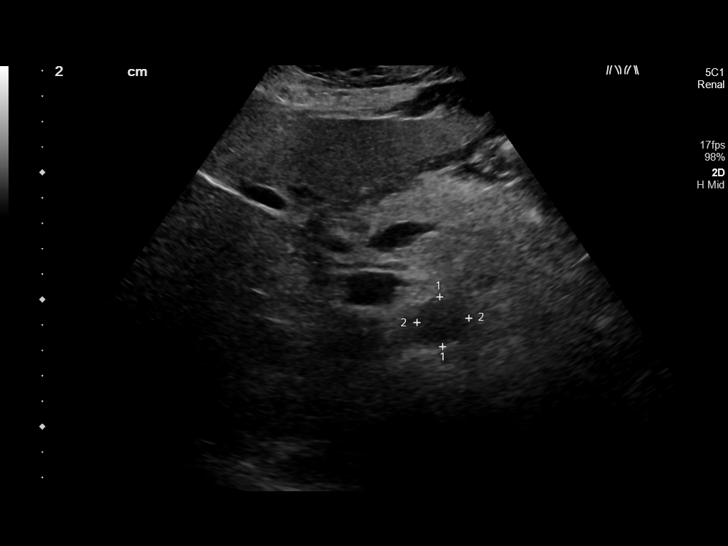
[im 12/69]
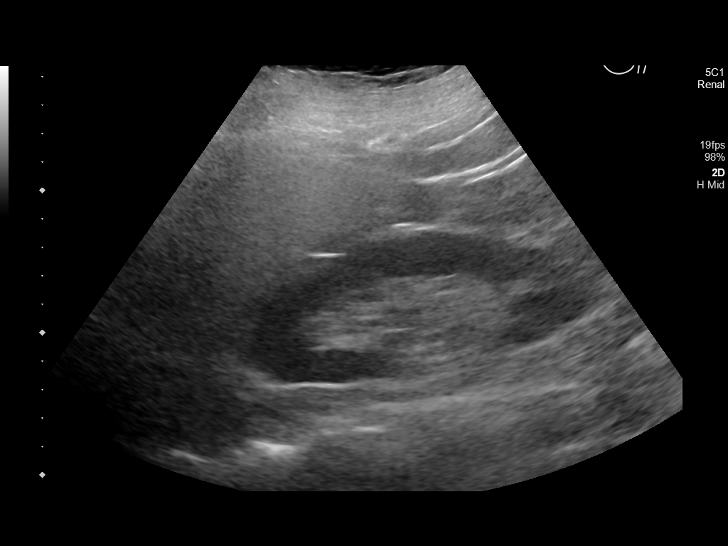
[im 18/69]
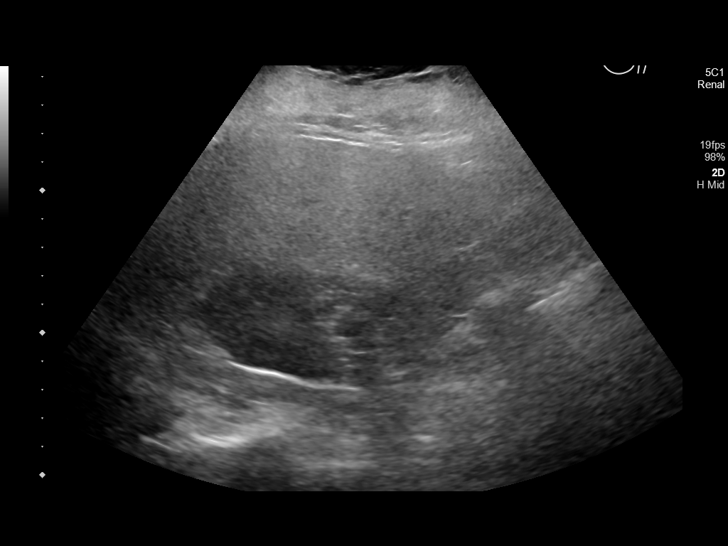
[im 23/69]
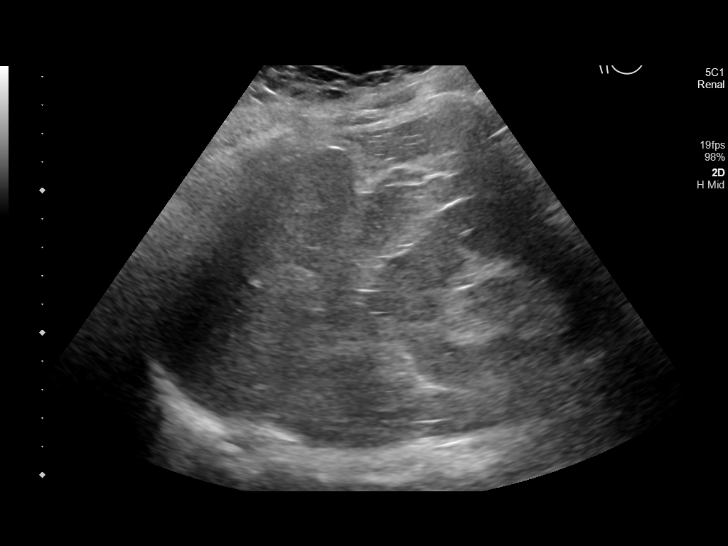
[im 26/69]
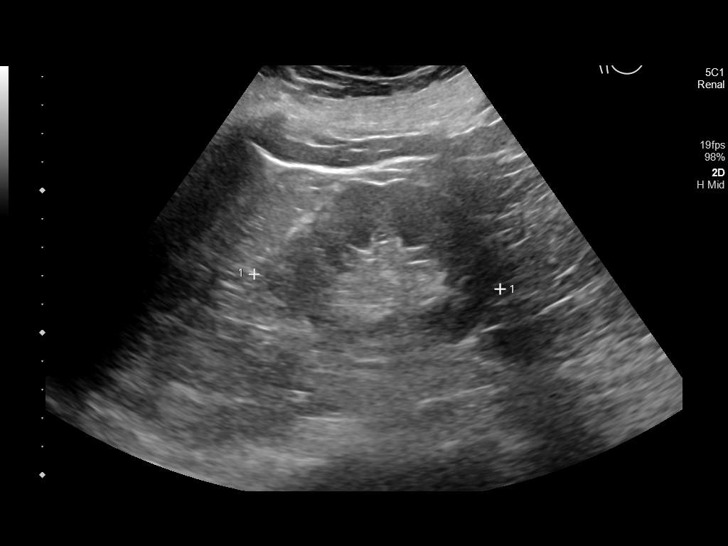
[im 32/69]
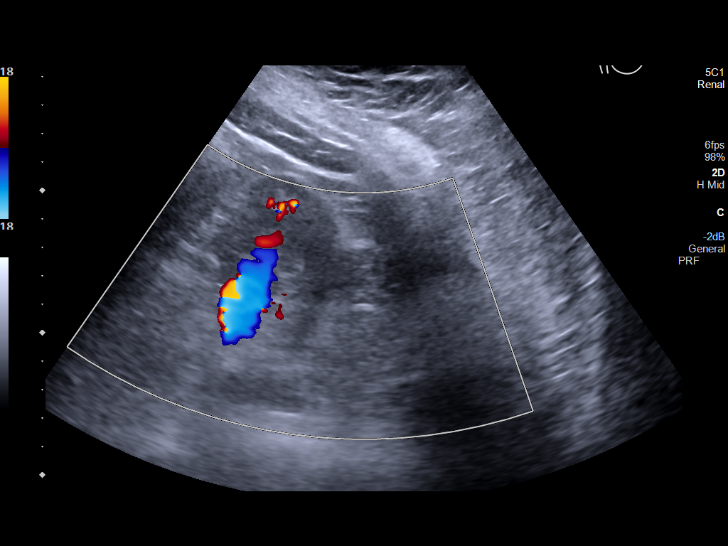
[im 37/69]
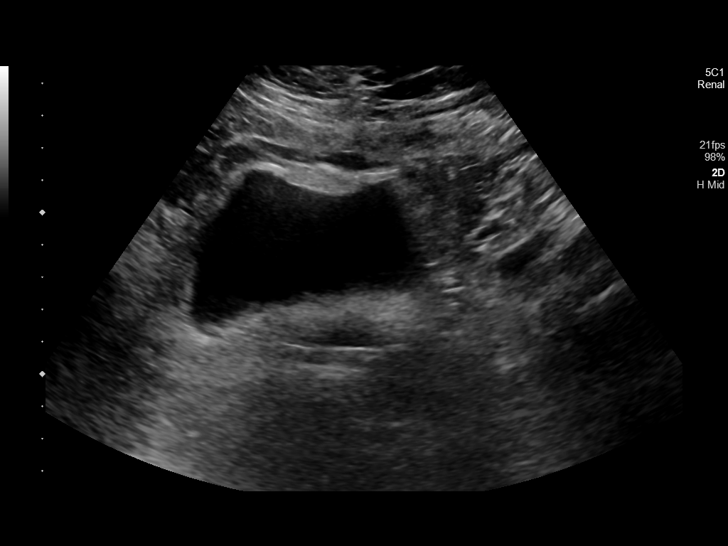
[im 43/69]
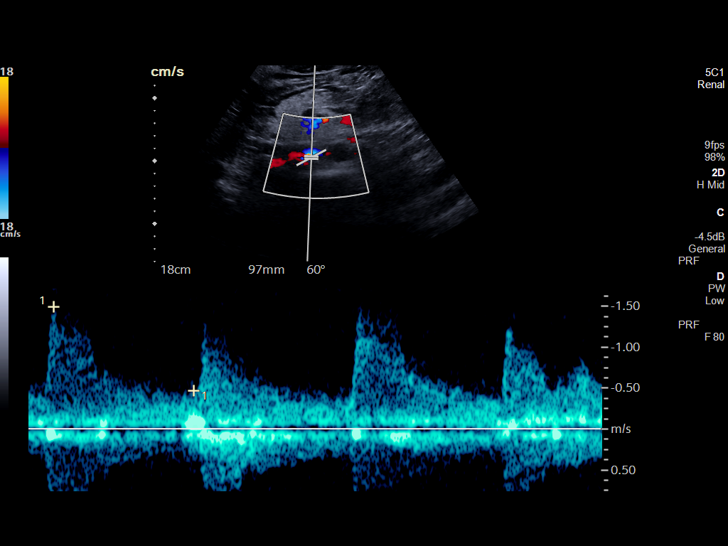
[im 46/69]
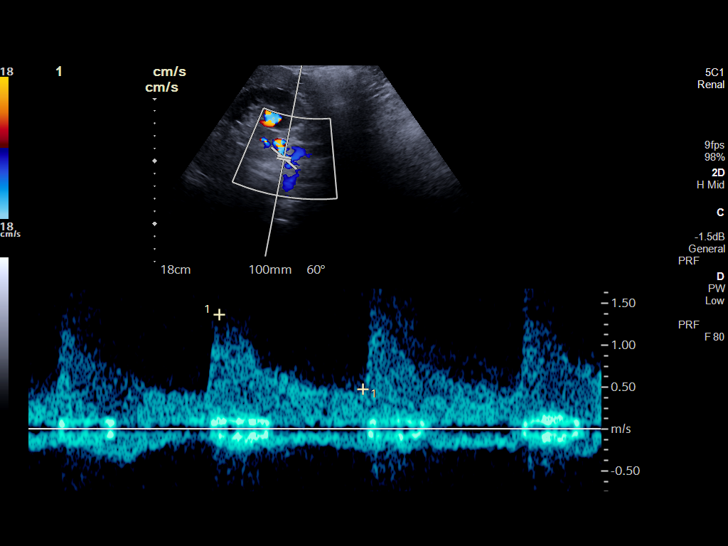
[im 52/69]
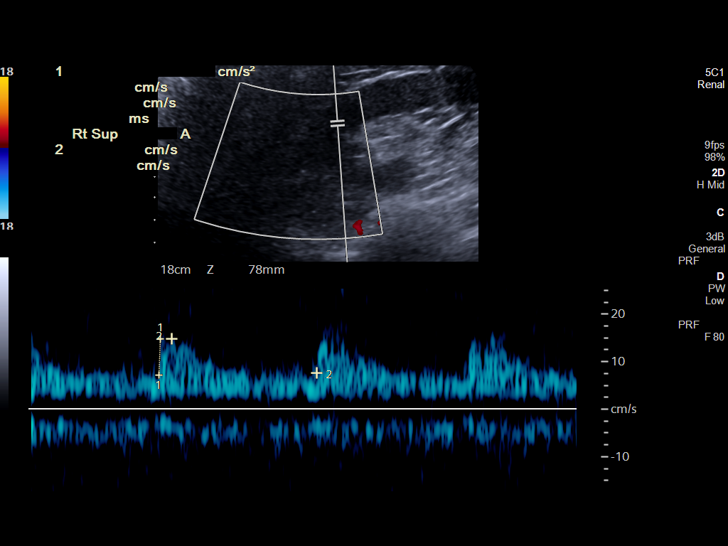
[im 57/69]
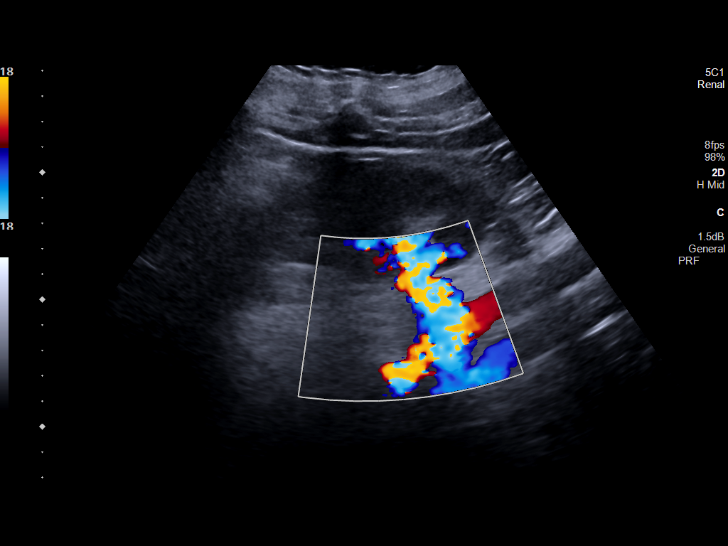
[im 63/69]
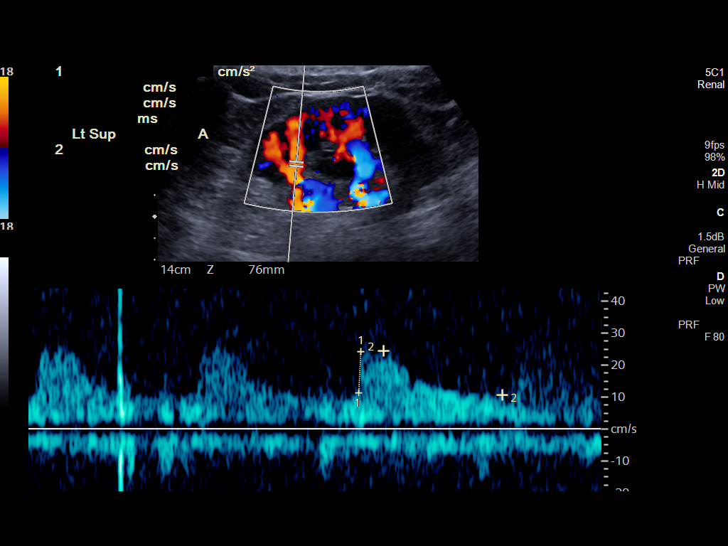
[im 69/69]
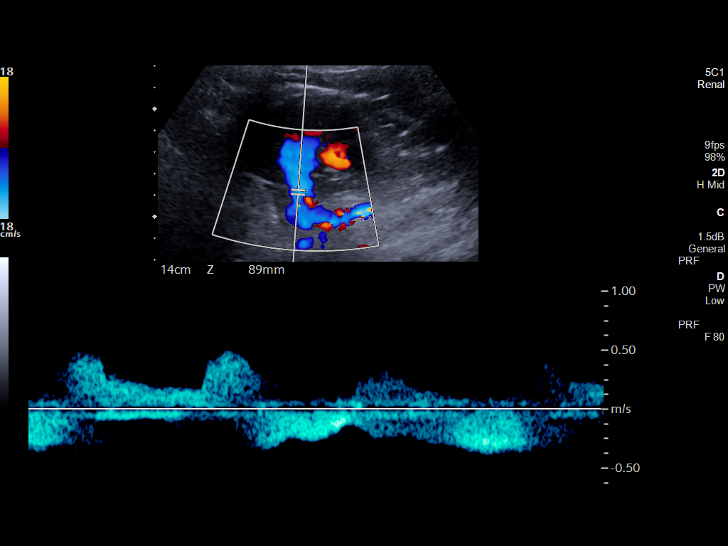

[14 of 25 positions shown; findings below may reference images not displayed]

FINDINGS: Right Kidney:

Length: 12.5 cm. Echogenicity within normal limits. No mass or
hydronephrosis visualized.

Left Kidney:

Length: 8.7 cm. Echogenicity within normal limits. No mass or
hydronephrosis visualized.

Bladder:  Within normal limits.

RENAL DUPLEX ULTRASOUND

Right Renal Artery Velocities:

Origin:  150 cm/sec

Mid:  137 cm/sec

Hilum:  168 cm/sec

Interlobar:  77 cm/sec

Arcuate:  20 cm/sec

Left Renal Artery Velocities:

Origin:  151 cm/sec

Mid:  132 cm/sec

Hilum:  70 cm/sec

Interlobar:  37 cm/sec

Arcuate:  21 cm/sec

Aortic Velocity:  109 cm/sec

Right Renal-Aortic Ratios:

Origin:

Mid:

Hilum:

Interlobar:

Arcuate:

Left Renal-Aortic Ratios:

Origin:

Mid:

Hilum:

Interlobar:

Arcuate:
IMPRESSION: No evidence of bilateral renal artery stenosis.

## 2023-03-16 ENCOUNTER — Other Ambulatory Visit: Payer: Self-pay | Admitting: Family Medicine

## 2023-03-16 DIAGNOSIS — R609 Edema, unspecified: Secondary | ICD-10-CM

## 2023-05-07 ENCOUNTER — Ambulatory Visit: Admission: RE | Admit: 2023-05-07 | Payer: PRIVATE HEALTH INSURANCE | Source: Ambulatory Visit

## 2023-12-10 ENCOUNTER — Other Ambulatory Visit: Payer: Self-pay

## 2023-12-10 ENCOUNTER — Emergency Department: Payer: PRIVATE HEALTH INSURANCE

## 2023-12-10 ENCOUNTER — Emergency Department
Admission: EM | Admit: 2023-12-10 | Discharge: 2023-12-10 | Disposition: A | Discharge status: Home / Self Care | Payer: PRIVATE HEALTH INSURANCE | Attending: Emergency Medicine | Admitting: Emergency Medicine

## 2023-12-10 DIAGNOSIS — W010XXA Fall on same level from slipping, tripping and stumbling without subsequent striking against object, initial encounter: Secondary | ICD-10-CM | POA: Insufficient documentation

## 2023-12-10 DIAGNOSIS — S0990XA Unspecified injury of head, initial encounter: Secondary | ICD-10-CM | POA: Diagnosis present

## 2023-12-10 DIAGNOSIS — T148XXA Other injury of unspecified body region, initial encounter: Secondary | ICD-10-CM

## 2023-12-10 DIAGNOSIS — S0083XA Contusion of other part of head, initial encounter: Secondary | ICD-10-CM | POA: Diagnosis not present

## 2023-12-10 DIAGNOSIS — I10 Essential (primary) hypertension: Secondary | ICD-10-CM | POA: Diagnosis not present

## 2023-12-10 MED ORDER — TETANUS-DIPHTH-ACELL PERTUSSIS 5-2.5-18.5 LF-MCG/0.5 IM SUSY
0.5000 mL | PREFILLED_SYRINGE | Freq: Once | INTRAMUSCULAR | Status: DC
  Filled 2023-12-10: qty 0.5

## 2023-12-10 NOTE — Discharge Instructions (Addendum)
 Your CT scans were normal.  Please follow-up with your outpatient provider.  Please return for any new, worsening, or change in symptoms or other concerns.  Please return to the emergency department if you develop dizziness, vomiting, visual changes, trouble walking or speaking, or any other concerns.

## 2023-12-10 NOTE — ED Provider Notes (Signed)
 [facility] Provider Note    Event Date/Time   First MD Initiated Contact with Patient 12/10/23 1225     (approximate)   History   No chief complaint on file.   HPI  Emily Pena is a 73 y.o. female who presents today for evaluation of head injury.  Patient reports that she was carrying something and did not see the curb and she tripped and landed face first.  She reports that she struck her forehead.  She has not had any vomiting.  There was no LOC at the time of the event.  She reports that she also scraped her left knee and her right elbow but is not concerned about these locations.  She has full and normal range of motion of these joints.  She is able to ambulate without difficulty.  She is not anticoagulated.  Patient Active Problem List   Diagnosis Date Noted   Aortic atherosclerosis (H[facility]) 05/31/2020   Hepatic steatosis 05/31/2020   Liver mass 04/19/2020   Hypokalemia 04/19/2020   Hyperlipidemia, mixed 02/24/2019   Primary osteoarthritis 12/07/2017   Current moderate episode of major depressive disorder without prior episode ([facility]) 06/02/2017   History of kidney stones 06/02/2017   Vitiligo 06/02/2017   Lichen planus 06/02/2017   Environmental and seasonal allergies 06/02/2017   Essential hypertension 05/09/2013           Physical Exam   Triage Vital Signs: ED Triage Vitals  Encounter Vitals Group     BP 12/10/23 1111 (!) 156/78     Girls Systolic BP Percentile --      Girls Diastolic BP Percentile --      Boys Systolic BP Percentile --      Boys Diastolic BP Percentile --      Pulse Rate 12/10/23 1111 78     Resp 12/10/23 1111 18     Temp 12/10/23 1111 98 F (36.7 C)     Temp Source 12/10/23 1111 Oral     SpO2 12/10/23 1111 98 %     Weight --      Height --      Head Circumference --      Peak Flow --      Pain Score 12/10/23 1110 5     Pain Loc --      Pain Education --      Exclude from Growth Chart --     Most  recent vital signs: Vitals:   12/10/23 1111  BP: (!) 156/78  Pulse: 78  Resp: 18  Temp: 98 F (36.7 C)  SpO2: 98%    Physical Exam Vitals and nursing note reviewed.  Constitutional:      General: Awake and alert. No acute distress.    Appearance: Normal appearance. The patient is normal weight.  HENT:     Head: Normocephalic.  Superficial abrasion noted to left forehead with mild swelling    Mouth: Mucous membranes are moist.  Eyes:     General: PERRL. Normal EOMs        Right eye: No discharge.        Left eye: No discharge.     Conjunctiva/sclera: Conjunctivae normal.  Cardiovascular:     Rate and Rhythm: Normal rate and regular rhythm.     Pulses: Normal pulses.  Pulmonary:     Effort: Pulmonary effort is normal. No respiratory distress.     Breath sounds: Normal breath sounds.  Abdominal:     Abdomen is  soft. There is no abdominal tenderness. No rebound or guarding. No distention. Musculoskeletal:        General: No swelling. Normal range of motion.     Cervical back: Normal range of motion and neck supple. No midline cervical spine tenderness.  Full range of motion of neck.  Negative Spurling test.   Normal strength and sensation in bilateral upper extremities. Normal grip strength bilaterally.  Normal intrinsic muscle function of the hand bilaterally.  Normal radial pulses bilaterally. Superficial abrasion noted to left knee and right elbow, though full and normal range of motion of these joints. Skin:    General: Skin is warm and dry.     Capillary Refill: Capillary refill takes less than 2 seconds.     Findings: No rash.  Neurological:     Mental Status: The patient is awake and alert.   Neurological: GCS 15 alert and oriented x3 Normal speech, no expressive or receptive aphasia or dysarthria Cranial nerves II through XII intact Normal visual fields 5 out of 5 strength in all 4 extremities with intact sensation throughout No extremity drift Normal  finger-to-nose testing, no limb or truncal ataxia    ED Results / Procedures / Treatments   Labs (all labs ordered are listed, but only abnormal results are displayed) Labs Reviewed - No data to display   EKG     RADIOLOGY I independently reviewed and interpreted imaging and agree with radiologists findings.     PROCEDURES:  Critical Care performed:   Procedures   MEDICATIONS ORDERED IN ED: Medications  Tdap (BOOSTRIX) injection 0.5 mL (0.5 mLs Intramuscular Patient Refused/Not Given 12/10/23 1443)     IMPRESSION / MDM / ASSESSMENT AND PLAN / ED COURSE  I reviewed the triage vital signs and the nursing notes.   Differential diagnosis includes, but is not limited to, abrasion, concussion, contusion, intracranial hemorrhage, cervical spine injury.  Patient presents to the emergency department after fall with head strike. No neurological deficits or midline spinal tenderness. Not encephalopathic, overall well-appearing.  CT head and neck obtained per [place] criteria.  Physical examination and imaging reassuring against urgent or emergent traumatic process.  Abrasions were cleaned.  These appear to be superficial.  No underlying bony tenderness.  Full and normal range of motion of her joints, do not suspect fracture.  She was given an updated tetanus shot.  We discussed wound care and return precautions.  We also discussed head injuries and concussions and return precautions from head injury standpoint.  Discussed care plan, return precautions, and advised close outpatient follow-up. Patient agrees with plan of care.  Ambulatory with a steady gait.  Discharged in stable condition.   Patient's presentation is most consistent with acute complicated illness / injury requiring diagnostic workup.    FINAL CLINICAL IMPRESSION(S) / ED DIAGNOSES   Final diagnoses:  Injury of head, initial encounter  Abrasion  Contusion of other part of head, initial encounter     Rx / DC  Orders   ED Discharge Orders     None        Note:  This document was prepared using Dragon voice recognition software and may include unintentional dictation errors.   Courtlynn Holloman E, PA-C 12/10/23 1512    Ernest Ronal BRAVO, MD 12/11/23 [phone]

## 2023-12-10 NOTE — Telephone Encounter (Signed)
 Copied from CRM #[id number]. Topic: Nurse Triage Scheduling - x Pt Declined Triage >> Dec 10, 2023 11:03 AM Lauraine Mclean, RN wrote: Emily Pena support request related to Urgent Care Initiated Triage Primary Complaint/Symptom: Fall with head injury  PASS Support Need: Cancel appointment
